# Patient Record
Sex: Male | Born: 1965 | Race: White | Hispanic: No | Marital: Married | Smoking: Never smoker
Health system: Southern US, Community
[De-identification: ages and names within clinical notes are randomized; demographics above are authoritative.]

## PROBLEM LIST (undated history)

## (undated) DIAGNOSIS — Z79899 Other long term (current) drug therapy: Secondary | ICD-10-CM

## (undated) DIAGNOSIS — R252 Cramp and spasm: Secondary | ICD-10-CM

## (undated) DIAGNOSIS — E041 Nontoxic single thyroid nodule: Secondary | ICD-10-CM

## (undated) DIAGNOSIS — M549 Dorsalgia, unspecified: Secondary | ICD-10-CM

## (undated) DIAGNOSIS — E890 Postprocedural hypothyroidism: Secondary | ICD-10-CM

## (undated) DIAGNOSIS — C73 Malignant neoplasm of thyroid gland: Secondary | ICD-10-CM

## (undated) DIAGNOSIS — N419 Inflammatory disease of prostate, unspecified: Secondary | ICD-10-CM

## (undated) HISTORY — DX: Postprocedural hypothyroidism: E89.0

## (undated) HISTORY — DX: Malignant neoplasm of thyroid gland: C73

## (undated) HISTORY — DX: Other long term (current) drug therapy: Z79.899

## (undated) HISTORY — DX: Cramp and spasm: R25.2

## (undated) HISTORY — DX: Inflammatory disease of prostate, unspecified: N41.9

## (undated) HISTORY — DX: Nontoxic single thyroid nodule: E04.1

## (undated) HISTORY — DX: Dorsalgia, unspecified: M54.9

---

## 1999-11-02 DIAGNOSIS — N419 Inflammatory disease of prostate, unspecified: Secondary | ICD-10-CM

## 1999-11-02 HISTORY — DX: Inflammatory disease of prostate, unspecified: N41.9

## 2000-12-07 LAB — HM COLONOSCOPY

## 2003-09-18 ENCOUNTER — Encounter: Admission: RE | Admit: 2003-09-18 | Discharge: 2003-09-18 | Payer: Self-pay | Admitting: Endocrinology

## 2004-12-02 ENCOUNTER — Ambulatory Visit: Payer: Self-pay | Admitting: Internal Medicine

## 2005-08-06 ENCOUNTER — Ambulatory Visit: Payer: Self-pay | Admitting: Endocrinology

## 2005-08-13 ENCOUNTER — Ambulatory Visit: Payer: Self-pay | Admitting: Endocrinology

## 2006-08-10 ENCOUNTER — Ambulatory Visit: Payer: Self-pay | Admitting: Endocrinology

## 2006-08-10 LAB — CONVERTED CEMR LAB
ALT: 22 units/L (ref 0–40)
AST: 21 units/L (ref 0–37)
Albumin: 4 g/dL (ref 3.5–5.2)
Alkaline Phosphatase: 52 units/L (ref 39–117)
BUN: 12 mg/dL (ref 6–23)
Basophils Absolute: 0 10*3/uL (ref 0.0–0.1)
Basophils Relative: 0.7 % (ref 0.0–1.0)
Bilirubin Urine: NEGATIVE
CO2: 30 meq/L (ref 19–32)
Calcium: 9.7 mg/dL (ref 8.4–10.5)
Chloride: 105 meq/L (ref 96–112)
Chol/HDL Ratio, serum: 3.5
Cholesterol: 145 mg/dL (ref 0–200)
Creatinine, Ser: 1.1 mg/dL (ref 0.4–1.5)
Eosinophil percent: 1.6 % (ref 0.0–5.0)
GFR calc non Af Amer: 80 mL/min
Glomerular Filtration Rate, Af Am: 96 mL/min/{1.73_m2}
Glucose, Bld: 87 mg/dL (ref 70–99)
HCT: 46.4 % (ref 39.0–52.0)
HDL: 41.1 mg/dL (ref >39.0)
Hemoglobin, Urine: NEGATIVE
Hemoglobin: 15.9 g/dL (ref 13.0–17.0)
Ketones, ur: NEGATIVE mg/dL
LDL Cholesterol: 92 mg/dL (ref 0–99)
Leukocytes, UA: NEGATIVE
Lymphocytes Relative: 31.9 % (ref 12.0–46.0)
MCHC: 34.2 g/dL (ref 30.0–36.0)
MCV: 88.8 fL (ref 78.0–100.0)
Monocytes Absolute: 0.4 10*3/uL (ref 0.2–0.7)
Monocytes Relative: 7.2 % (ref 3.0–11.0)
Neutro Abs: 3.2 10*3/uL (ref 1.4–7.7)
Neutrophils Relative %: 58.6 % (ref 43.0–77.0)
Nitrite: NEGATIVE
Platelets: 176 10*3/uL (ref 150–400)
Potassium: 4.6 meq/L (ref 3.5–5.1)
RBC: 5.23 M/uL (ref 4.22–5.81)
RDW: 11.5 % (ref 11.5–14.6)
Sodium: 141 meq/L (ref 135–145)
Specific Gravity, Urine: 1.025 (ref 1.000–1.03)
TSH: 2.3 microintl units/mL (ref 0.35–5.50)
Total Bilirubin: 1.1 mg/dL (ref 0.3–1.2)
Total Protein, Urine: NEGATIVE mg/dL
Total Protein: 6.6 g/dL (ref 6.0–8.3)
Triglyceride fasting, serum: 60 mg/dL (ref 0–149)
Urine Glucose: NEGATIVE mg/dL
Urobilinogen, UA: 0.2 (ref 0.0–1.0)
VLDL: 12 mg/dL (ref 0–40)
WBC: 5.5 10*3/uL (ref 4.5–10.5)
pH: 6 (ref 5.0–8.0)

## 2006-08-15 ENCOUNTER — Ambulatory Visit: Payer: Self-pay | Admitting: Endocrinology

## 2007-01-09 ENCOUNTER — Ambulatory Visit: Payer: Self-pay | Admitting: Internal Medicine

## 2007-01-09 ENCOUNTER — Ambulatory Visit (HOSPITAL_COMMUNITY): Admission: RE | Admit: 2007-01-09 | Discharge: 2007-01-09 | Payer: Self-pay | Admitting: Internal Medicine

## 2007-07-13 ENCOUNTER — Encounter: Payer: Self-pay | Admitting: Endocrinology

## 2007-08-14 ENCOUNTER — Ambulatory Visit: Payer: Self-pay | Admitting: Endocrinology

## 2007-08-14 LAB — CONVERTED CEMR LAB
ALT: 21 units/L (ref 0–53)
AST: 27 units/L (ref 0–37)
Albumin: 4 g/dL (ref 3.5–5.2)
Alkaline Phosphatase: 50 units/L (ref 39–117)
BUN: 20 mg/dL (ref 6–23)
Basophils Absolute: 0 10*3/uL (ref 0.0–0.1)
Basophils Relative: 0.4 % (ref 0.0–1.0)
Bilirubin Urine: NEGATIVE
Bilirubin, Direct: 0.2 mg/dL (ref 0.0–0.3)
CO2: 30 meq/L (ref 19–32)
Calcium: 9.4 mg/dL (ref 8.4–10.5)
Chloride: 106 meq/L (ref 96–112)
Cholesterol: 142 mg/dL (ref 0–200)
Creatinine, Ser: 1 mg/dL (ref 0.4–1.5)
Eosinophils Absolute: 0.1 10*3/uL (ref 0.0–0.6)
Eosinophils Relative: 1.2 % (ref 0.0–5.0)
GFR calc Af Amer: 107 mL/min
GFR calc non Af Amer: 88 mL/min
Glucose, Bld: 81 mg/dL (ref 70–99)
HCT: 44.3 % (ref 39.0–52.0)
HDL: 47.6 mg/dL (ref >39.0)
Hemoglobin, Urine: NEGATIVE
Hemoglobin: 15.4 g/dL (ref 13.0–17.0)
Ketones, ur: NEGATIVE mg/dL
LDL Cholesterol: 82 mg/dL (ref 0–99)
Leukocytes, UA: NEGATIVE
Lymphocytes Relative: 30.7 % (ref 12.0–46.0)
MCHC: 34.7 g/dL (ref 30.0–36.0)
MCV: 89.1 fL (ref 78.0–100.0)
Monocytes Absolute: 0.4 10*3/uL (ref 0.2–0.7)
Monocytes Relative: 7.7 % (ref 3.0–11.0)
Neutro Abs: 3.1 10*3/uL (ref 1.4–7.7)
Neutrophils Relative %: 60 % (ref 43.0–77.0)
Nitrite: NEGATIVE
Platelets: 150 10*3/uL (ref 150–400)
Potassium: 4.8 meq/L (ref 3.5–5.1)
RBC: 4.97 M/uL (ref 4.22–5.81)
RDW: 11.5 % (ref 11.5–14.6)
Sodium: 142 meq/L (ref 135–145)
Specific Gravity, Urine: 1.01 (ref 1.000–1.03)
TSH: 1.99 microintl units/mL (ref 0.35–5.50)
Total Bilirubin: 0.9 mg/dL (ref 0.3–1.2)
Total CHOL/HDL Ratio: 3
Total Protein, Urine: NEGATIVE mg/dL
Total Protein: 6.6 g/dL (ref 6.0–8.3)
Triglycerides: 62 mg/dL (ref 0–149)
Urine Glucose: NEGATIVE mg/dL
Urobilinogen, UA: 0.2 (ref 0.0–1.0)
VLDL: 12 mg/dL (ref 0–40)
WBC: 5.2 10*3/uL (ref 4.5–10.5)
pH: 7 (ref 5.0–8.0)

## 2007-08-18 ENCOUNTER — Ambulatory Visit: Payer: Self-pay | Admitting: Endocrinology

## 2008-04-04 ENCOUNTER — Ambulatory Visit: Payer: Self-pay | Admitting: Internal Medicine

## 2008-04-04 DIAGNOSIS — M549 Dorsalgia, unspecified: Secondary | ICD-10-CM | POA: Insufficient documentation

## 2008-05-16 ENCOUNTER — Telehealth: Payer: Self-pay | Admitting: Endocrinology

## 2008-08-08 ENCOUNTER — Telehealth (INDEPENDENT_AMBULATORY_CARE_PROVIDER_SITE_OTHER): Payer: Self-pay | Admitting: *Deleted

## 2008-08-16 ENCOUNTER — Ambulatory Visit: Payer: Self-pay | Admitting: Endocrinology

## 2008-08-18 LAB — CONVERTED CEMR LAB
ALT: 18 units/L (ref 0–53)
AST: 21 units/L (ref 0–37)
Albumin: 4.2 g/dL (ref 3.5–5.2)
Alkaline Phosphatase: 50 units/L (ref 39–117)
BUN: 18 mg/dL (ref 6–23)
Basophils Absolute: 0 10*3/uL (ref 0.0–0.1)
Basophils Relative: 0.7 % (ref 0.0–3.0)
Bilirubin Urine: NEGATIVE
Bilirubin, Direct: 0.2 mg/dL (ref 0.0–0.3)
CO2: 30 meq/L (ref 19–32)
Calcium: 9.6 mg/dL (ref 8.4–10.5)
Chloride: 104 meq/L (ref 96–112)
Cholesterol: 130 mg/dL (ref 0–200)
Creatinine, Ser: 1 mg/dL (ref 0.4–1.5)
Eosinophils Absolute: 0.1 10*3/uL (ref 0.0–0.7)
Eosinophils Relative: 1.1 % (ref 0.0–5.0)
GFR calc Af Amer: 106 mL/min
GFR calc non Af Amer: 88 mL/min
Glucose, Bld: 82 mg/dL (ref 70–99)
HCT: 44.6 % (ref 39.0–52.0)
HDL: 37.3 mg/dL — ABNORMAL LOW (ref >39.0)
Hemoglobin, Urine: NEGATIVE
Hemoglobin: 15.7 g/dL (ref 13.0–17.0)
Ketones, ur: NEGATIVE mg/dL
LDL Cholesterol: 78 mg/dL (ref 0–99)
Leukocytes, UA: NEGATIVE
Lymphocytes Relative: 30.3 % (ref 12.0–46.0)
MCHC: 35.2 g/dL (ref 30.0–36.0)
MCV: 89.9 fL (ref 78.0–100.0)
Monocytes Absolute: 0.4 10*3/uL (ref 0.1–1.0)
Monocytes Relative: 6.8 % (ref 3.0–12.0)
Neutro Abs: 3.3 10*3/uL (ref 1.4–7.7)
Neutrophils Relative %: 61.1 % (ref 43.0–77.0)
Nitrite: NEGATIVE
PSA: 0.84 ng/mL (ref 0.10–4.00)
Platelets: 160 10*3/uL (ref 150–400)
Potassium: 4.6 meq/L (ref 3.5–5.1)
RBC: 4.96 M/uL (ref 4.22–5.81)
RDW: 11.7 % (ref 11.5–14.6)
Sodium: 140 meq/L (ref 135–145)
Specific Gravity, Urine: 1.02 (ref 1.000–1.03)
TSH: 1.95 microintl units/mL (ref 0.35–5.50)
Total Bilirubin: 1.2 mg/dL (ref 0.3–1.2)
Total CHOL/HDL Ratio: 3.5
Total Protein: 7.1 g/dL (ref 6.0–8.3)
Triglycerides: 75 mg/dL (ref 0–149)
Urine Glucose: NEGATIVE mg/dL
Urobilinogen, UA: 0.2 (ref 0.0–1.0)
VLDL: 15 mg/dL (ref 0–40)
WBC: 5.4 10*3/uL (ref 4.5–10.5)
pH: 6 (ref 5.0–8.0)

## 2008-08-21 ENCOUNTER — Ambulatory Visit: Payer: Self-pay | Admitting: Endocrinology

## 2008-08-22 ENCOUNTER — Ambulatory Visit: Payer: Self-pay | Admitting: Endocrinology

## 2008-08-22 ENCOUNTER — Encounter: Admission: RE | Admit: 2008-08-22 | Discharge: 2008-08-22 | Payer: Self-pay | Admitting: Endocrinology

## 2008-08-22 ENCOUNTER — Telehealth: Payer: Self-pay | Admitting: Endocrinology

## 2008-08-22 ENCOUNTER — Other Ambulatory Visit: Admission: RE | Admit: 2008-08-22 | Discharge: 2008-08-22 | Payer: Self-pay | Admitting: Endocrinology

## 2008-08-22 ENCOUNTER — Encounter: Payer: Self-pay | Admitting: Endocrinology

## 2008-08-26 ENCOUNTER — Telehealth: Payer: Self-pay | Admitting: Endocrinology

## 2008-08-28 ENCOUNTER — Encounter: Payer: Self-pay | Admitting: Endocrinology

## 2008-09-19 ENCOUNTER — Ambulatory Visit (HOSPITAL_COMMUNITY): Admission: RE | Admit: 2008-09-19 | Discharge: 2008-09-20 | Payer: Self-pay | Admitting: Surgery

## 2008-09-19 ENCOUNTER — Encounter (INDEPENDENT_AMBULATORY_CARE_PROVIDER_SITE_OTHER): Payer: Self-pay | Admitting: Surgery

## 2008-09-25 ENCOUNTER — Telehealth: Payer: Self-pay | Admitting: Endocrinology

## 2008-10-01 ENCOUNTER — Telehealth: Payer: Self-pay | Admitting: Endocrinology

## 2008-10-03 ENCOUNTER — Telehealth: Payer: Self-pay | Admitting: Endocrinology

## 2008-10-07 ENCOUNTER — Encounter: Payer: Self-pay | Admitting: Endocrinology

## 2008-11-05 ENCOUNTER — Ambulatory Visit: Payer: Self-pay | Admitting: Endocrinology

## 2008-11-05 DIAGNOSIS — E890 Postprocedural hypothyroidism: Secondary | ICD-10-CM

## 2008-11-05 DIAGNOSIS — C73 Malignant neoplasm of thyroid gland: Secondary | ICD-10-CM | POA: Insufficient documentation

## 2008-11-05 LAB — CONVERTED CEMR LAB: TSH: 2.06 microintl units/mL (ref 0.35–5.50)

## 2008-12-05 ENCOUNTER — Telehealth: Payer: Self-pay | Admitting: Endocrinology

## 2008-12-11 ENCOUNTER — Ambulatory Visit: Payer: Self-pay | Admitting: Endocrinology

## 2008-12-11 LAB — CONVERTED CEMR LAB: TSH: >100 microintl units/mL — ABNORMAL HIGH (ref 0.35–5.50)

## 2008-12-13 ENCOUNTER — Encounter (HOSPITAL_COMMUNITY): Admission: RE | Admit: 2008-12-13 | Discharge: 2008-12-13 | Payer: Self-pay | Admitting: Endocrinology

## 2009-03-13 ENCOUNTER — Ambulatory Visit: Payer: Self-pay | Admitting: Endocrinology

## 2009-03-13 DIAGNOSIS — R252 Cramp and spasm: Secondary | ICD-10-CM | POA: Insufficient documentation

## 2009-03-13 LAB — CONVERTED CEMR LAB
Calcium, Total (PTH): 9.2 mg/dL (ref 8.4–10.5)
PTH: 39.1 pg/mL (ref 14.0–72.0)
TSH: 2.88 microintl units/mL (ref 0.35–5.50)

## 2009-05-14 ENCOUNTER — Telehealth (INDEPENDENT_AMBULATORY_CARE_PROVIDER_SITE_OTHER): Payer: Self-pay | Admitting: *Deleted

## 2009-05-15 ENCOUNTER — Encounter: Payer: Self-pay | Admitting: Endocrinology

## 2009-06-09 ENCOUNTER — Ambulatory Visit: Payer: Self-pay | Admitting: Endocrinology

## 2009-06-11 LAB — CONVERTED CEMR LAB: TSH: 1.12 microintl units/mL (ref 0.35–5.50)

## 2009-06-16 ENCOUNTER — Ambulatory Visit: Payer: Self-pay | Admitting: Endocrinology

## 2009-07-17 ENCOUNTER — Ambulatory Visit: Payer: Self-pay | Admitting: Endocrinology

## 2009-07-17 LAB — CONVERTED CEMR LAB: Thyroglobulin Ab: 37.7 (ref 0.0–60.0)

## 2009-07-18 ENCOUNTER — Ambulatory Visit: Payer: Self-pay | Admitting: Endocrinology

## 2009-07-18 LAB — CONVERTED CEMR LAB: TSH: >100 microintl units/mL — ABNORMAL HIGH (ref 0.35–5.50)

## 2009-07-21 ENCOUNTER — Telehealth: Payer: Self-pay | Admitting: Endocrinology

## 2009-07-22 ENCOUNTER — Encounter: Admission: RE | Admit: 2009-07-22 | Discharge: 2009-07-22 | Payer: Self-pay | Admitting: Endocrinology

## 2009-07-24 ENCOUNTER — Telehealth (INDEPENDENT_AMBULATORY_CARE_PROVIDER_SITE_OTHER): Payer: Self-pay | Admitting: *Deleted

## 2009-07-25 ENCOUNTER — Telehealth: Payer: Self-pay | Admitting: Endocrinology

## 2009-07-25 ENCOUNTER — Ambulatory Visit (HOSPITAL_COMMUNITY): Admission: RE | Admit: 2009-07-25 | Discharge: 2009-07-25 | Payer: Self-pay | Admitting: Endocrinology

## 2009-08-01 ENCOUNTER — Telehealth: Payer: Self-pay | Admitting: Endocrinology

## 2009-08-04 ENCOUNTER — Encounter (HOSPITAL_COMMUNITY): Admission: RE | Admit: 2009-08-04 | Discharge: 2009-10-03 | Payer: Self-pay | Admitting: Endocrinology

## 2009-09-03 ENCOUNTER — Encounter: Payer: Self-pay | Admitting: Endocrinology

## 2009-09-08 IMAGING — US US SOFT TISSUE HEAD/NECK
1 series · 14 of 18 positions shown · non-contrast
Comparison: None

CLINICAL DATA: Evaluate.  Thyroid nodule.

THYROID ULTRASOUND
TECHNIQUE: Ultrasound examination of the thyroid gland and
adjacent soft tissues was performed.

[Series 1: us soft tissue head/neck · 0.07mm/px · 14 of 18 slices shown]
[im 1/18]
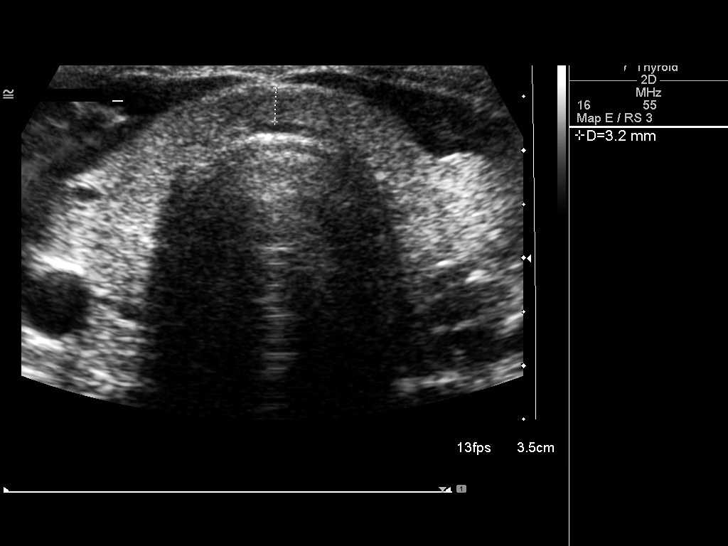
[im 2/18]
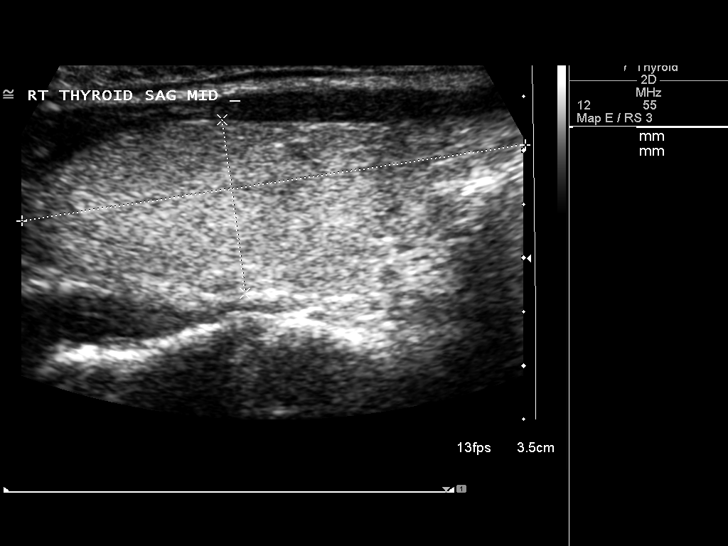
[im 4/18]
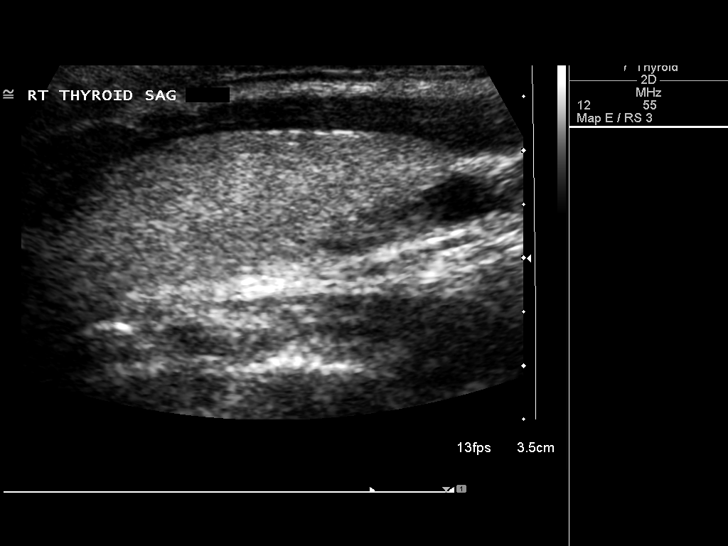
[im 5/18]
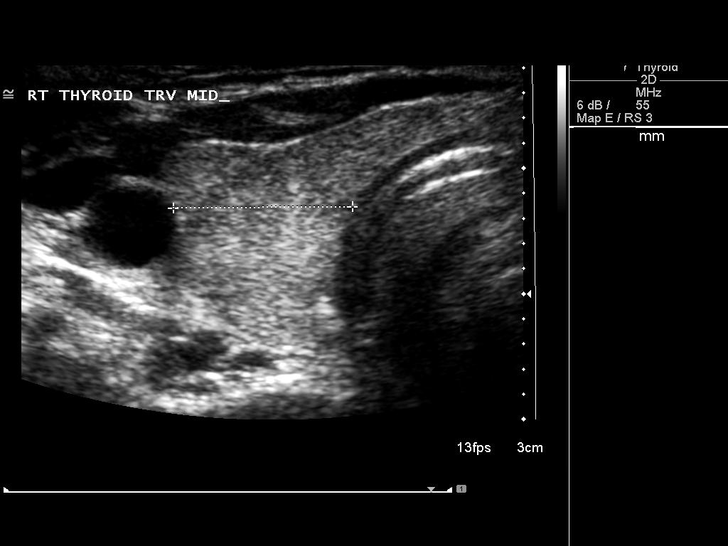
[im 6/18]
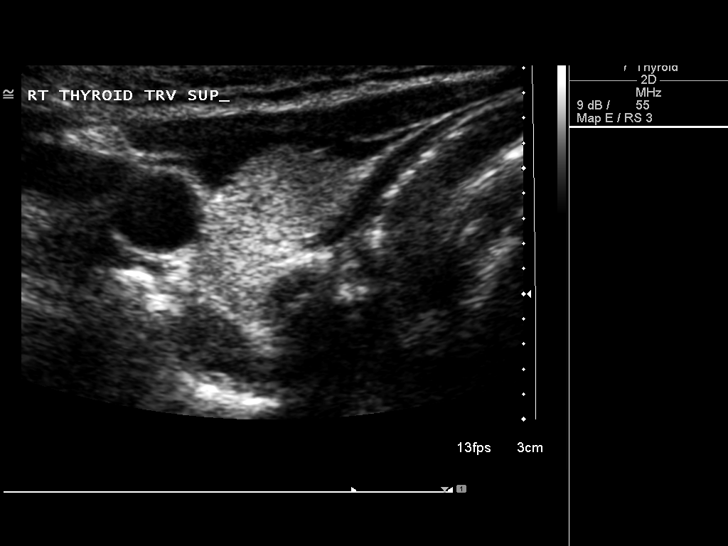
[im 8/18]
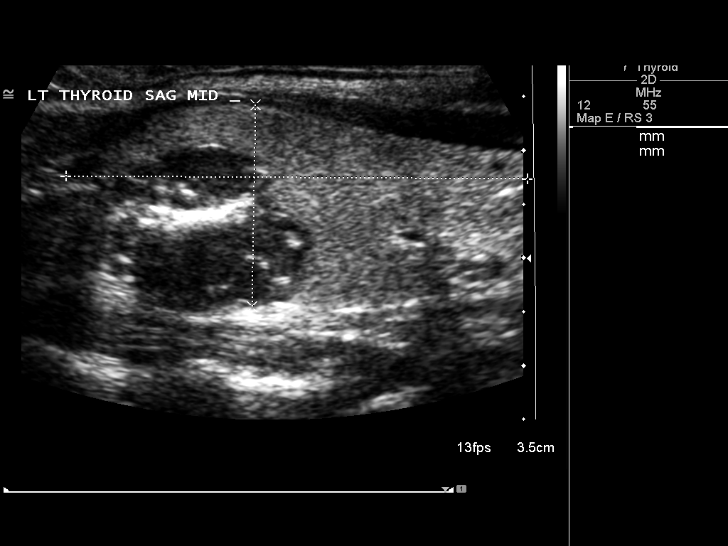
[im 9/18]
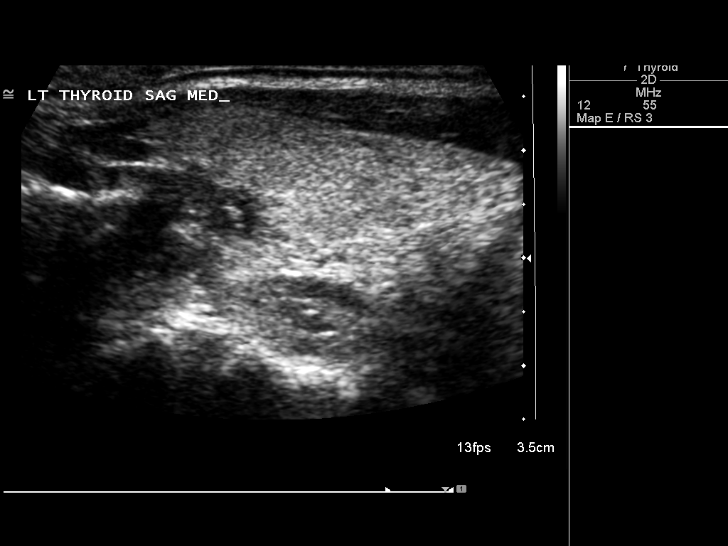
[im 10/18]
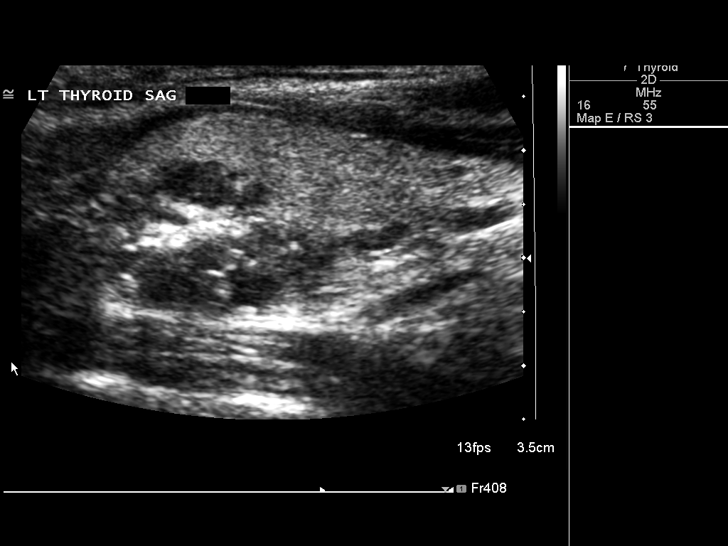
[im 11/18]
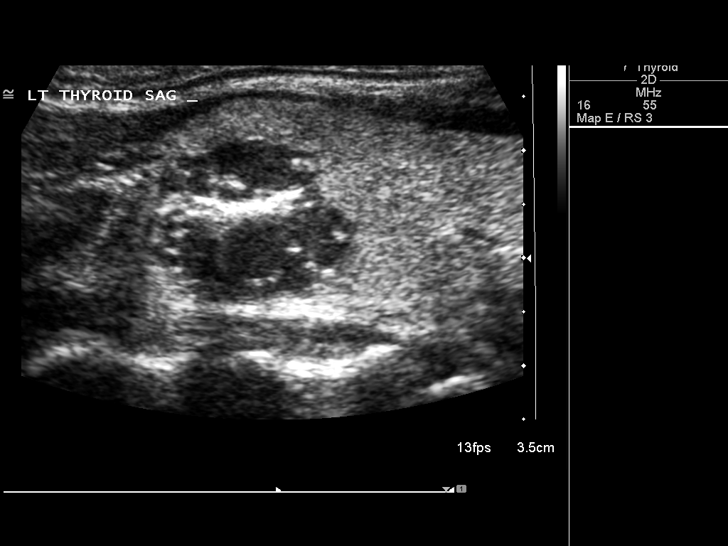
[im 13/18]
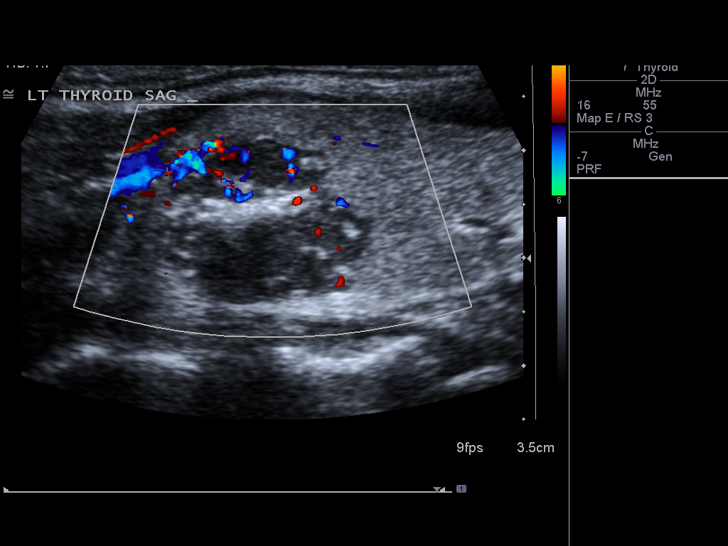
[im 14/18]
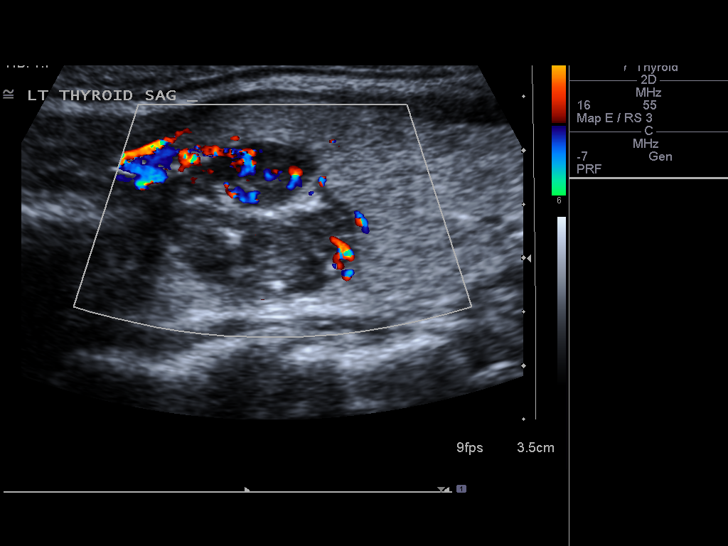
[im 15/18]
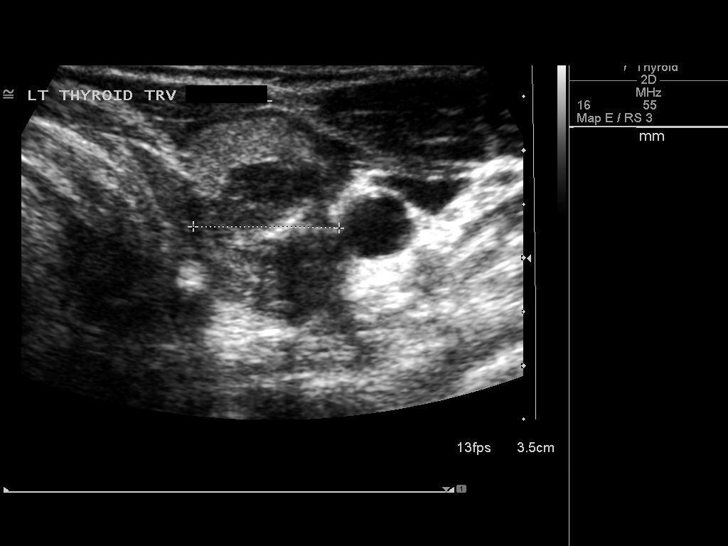
[im 17/18]
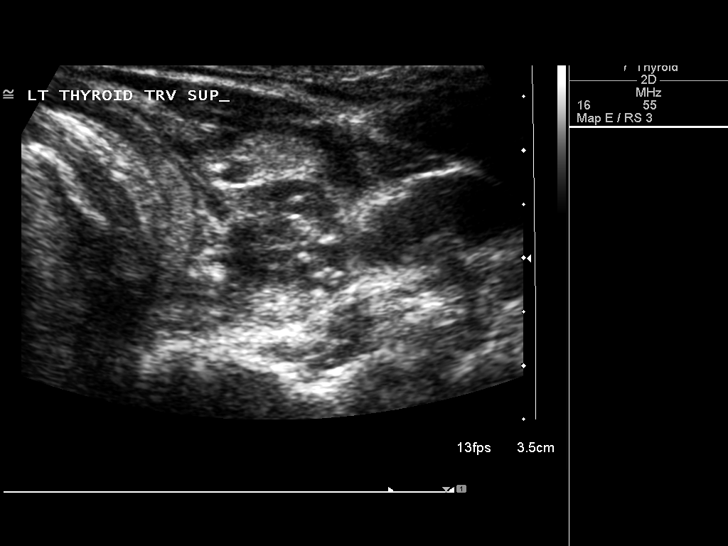
[im 18/18]
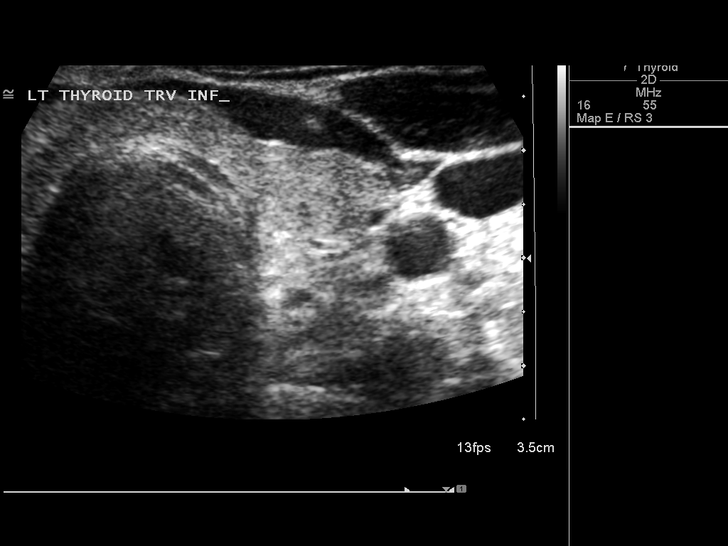

[14 of 18 positions shown; findings below may reference images not displayed]

FINDINGS: There is a 1.9 x 1.8 x 1.3 cm solid nodule within the
left lobe of the thyroid gland.  There is increased vascularity and
evidence of microcalcifications.

Remaining portions of the thyroid gland appear normal

The right lobe measures 4.7 x 1.6 x 1.4 cm.

The left lobe measures 4.3 x 1.81 x 1.5 cm.

The isthmus measures 3.2 mm.
IMPRESSION: 1.  Hypoechoic solid nodule within the left lobe of the thyroid
gland contains increased vascularity and evidence of
microcalcifications.  Correlation with percutaneous biopsy results
advised

Test results telephoned to Dr. Boodram at the time of

## 2009-09-15 ENCOUNTER — Ambulatory Visit: Payer: Self-pay | Admitting: Endocrinology

## 2009-09-15 LAB — CONVERTED CEMR LAB
ALT: 19 units/L (ref 0–53)
AST: 21 units/L (ref 0–37)
Albumin: 4.3 g/dL (ref 3.5–5.2)
Alkaline Phosphatase: 44 units/L (ref 39–117)
BUN: 24 mg/dL — ABNORMAL HIGH (ref 6–23)
Basophils Absolute: 0 10*3/uL (ref 0.0–0.1)
Basophils Relative: 0.1 % (ref 0.0–3.0)
Bilirubin Urine: NEGATIVE
Bilirubin, Direct: 0.2 mg/dL (ref 0.0–0.3)
CO2: 28 meq/L (ref 19–32)
Calcium: 8.6 mg/dL (ref 8.4–10.5)
Chloride: 104 meq/L (ref 96–112)
Cholesterol: 126 mg/dL (ref 0–200)
Creatinine, Ser: 1 mg/dL (ref 0.4–1.5)
Eosinophils Absolute: 0 10*3/uL (ref 0.0–0.7)
Eosinophils Relative: 0.2 % (ref 0.0–5.0)
GFR calc non Af Amer: 87.34 mL/min (ref >60)
Glucose, Bld: 93 mg/dL (ref 70–99)
HCT: 41.2 % (ref 39.0–52.0)
HDL: 47.1 mg/dL (ref >39.00)
Hemoglobin, Urine: NEGATIVE
Hemoglobin: 14.2 g/dL (ref 13.0–17.0)
Ketones, ur: NEGATIVE mg/dL
LDL Cholesterol: 73 mg/dL (ref 0–99)
Leukocytes, UA: NEGATIVE
Lymphocytes Relative: 3.5 % — ABNORMAL LOW (ref 12.0–46.0)
Lymphs Abs: 0.2 10*3/uL — ABNORMAL LOW (ref 0.7–4.0)
MCHC: 34.6 g/dL (ref 30.0–36.0)
MCV: 93.1 fL (ref 78.0–100.0)
Monocytes Absolute: 0.3 10*3/uL (ref 0.1–1.0)
Monocytes Relative: 5.2 % (ref 3.0–12.0)
Neutro Abs: 6.1 10*3/uL (ref 1.4–7.7)
Neutrophils Relative %: 91 % — ABNORMAL HIGH (ref 43.0–77.0)
Nitrite: NEGATIVE
PSA: 0.83 ng/mL (ref 0.10–4.00)
Platelets: 129 10*3/uL — ABNORMAL LOW (ref 150.0–400.0)
Potassium: 4.3 meq/L (ref 3.5–5.1)
RBC: 4.42 M/uL (ref 4.22–5.81)
RDW: 13.8 % (ref 11.5–14.6)
Sodium: 141 meq/L (ref 135–145)
Specific Gravity, Urine: 1.02 (ref 1.000–1.030)
TSH: 0.89 microintl units/mL (ref 0.35–5.50)
Total Bilirubin: 1.3 mg/dL — ABNORMAL HIGH (ref 0.3–1.2)
Total CHOL/HDL Ratio: 3
Total Protein, Urine: NEGATIVE mg/dL
Total Protein: 7.2 g/dL (ref 6.0–8.3)
Triglycerides: 30 mg/dL (ref 0.0–149.0)
Urine Glucose: NEGATIVE mg/dL
Urobilinogen, UA: 0.2 (ref 0.0–1.0)
VLDL: 6 mg/dL (ref 0.0–40.0)
WBC: 6.6 10*3/uL (ref 4.5–10.5)
pH: 5.5 (ref 5.0–8.0)

## 2009-10-15 ENCOUNTER — Ambulatory Visit: Payer: Self-pay | Admitting: Endocrinology

## 2009-10-15 DIAGNOSIS — D696 Thrombocytopenia, unspecified: Secondary | ICD-10-CM

## 2009-11-14 ENCOUNTER — Ambulatory Visit: Payer: Self-pay | Admitting: Endocrinology

## 2009-11-23 LAB — CONVERTED CEMR LAB
Basophils Absolute: 0 10*3/uL (ref 0.0–0.1)
Basophils Relative: 0.2 % (ref 0.0–3.0)
Eosinophils Absolute: 0 10*3/uL (ref 0.0–0.7)
Eosinophils Relative: 1.3 % (ref 0.0–5.0)
HCT: 44.6 % (ref 39.0–52.0)
Hemoglobin: 14.7 g/dL (ref 13.0–17.0)
Lymphocytes Relative: 25 % (ref 12.0–46.0)
Lymphs Abs: 0.9 10*3/uL (ref 0.7–4.0)
MCHC: 32.9 g/dL (ref 30.0–36.0)
MCV: 94.7 fL (ref 78.0–100.0)
Monocytes Absolute: 0.3 10*3/uL (ref 0.1–1.0)
Monocytes Relative: 8.6 % (ref 3.0–12.0)
Neutro Abs: 2.3 10*3/uL (ref 1.4–7.7)
Neutrophils Relative %: 64.9 % (ref 43.0–77.0)
Platelets: 120 10*3/uL — ABNORMAL LOW (ref 150.0–400.0)
RBC: 4.71 M/uL (ref 4.22–5.81)
RDW: 11.7 % (ref 11.5–14.6)
TSH: 0.14 microintl units/mL — ABNORMAL LOW (ref 0.35–5.50)
WBC: 3.5 10*3/uL — ABNORMAL LOW (ref 4.5–10.5)

## 2009-11-28 ENCOUNTER — Ambulatory Visit: Payer: Self-pay | Admitting: Endocrinology

## 2009-11-28 DIAGNOSIS — K1120 Sialoadenitis, unspecified: Secondary | ICD-10-CM | POA: Insufficient documentation

## 2009-11-28 LAB — CONVERTED CEMR LAB
Amylase: 638 U/L — ABNORMAL HIGH
Basophils Absolute: 0 K/uL
Basophils Relative: 0.4 %
Eosinophils Absolute: 0 K/uL
Eosinophils Relative: 0.6 %
HCT: 42.8 %
Hemoglobin: 14.1 g/dL
Lymphocytes Relative: 20 %
Lymphs Abs: 0.9 K/uL
MCHC: 32.9 g/dL
MCV: 93.4 fL
Monocytes Absolute: 0.4 K/uL
Monocytes Relative: 9.1 %
Neutro Abs: 3.3 K/uL
Neutrophils Relative %: 69.9 %
Platelets: 149 K/uL — ABNORMAL LOW
RBC: 4.58 M/uL
RDW: 11.6 %
WBC: 4.6 10*3/microliter

## 2009-12-01 LAB — CONVERTED CEMR LAB
Calcium, Total (PTH): 9 mg/dL (ref 8.4–10.5)
PTH: 30.3 pg/mL (ref 14.0–72.0)

## 2009-12-12 ENCOUNTER — Telehealth: Payer: Self-pay | Admitting: Endocrinology

## 2009-12-15 ENCOUNTER — Telehealth: Payer: Self-pay | Admitting: Endocrinology

## 2010-01-09 ENCOUNTER — Ambulatory Visit (HOSPITAL_COMMUNITY): Admission: RE | Admit: 2010-01-09 | Discharge: 2010-01-09 | Payer: Self-pay | Admitting: Otolaryngology

## 2010-01-30 ENCOUNTER — Telehealth: Payer: Self-pay | Admitting: Endocrinology

## 2010-01-30 ENCOUNTER — Ambulatory Visit: Payer: Self-pay | Admitting: Endocrinology

## 2010-01-30 LAB — CONVERTED CEMR LAB: TSH: 0.09 microintl units/mL — ABNORMAL LOW (ref 0.35–5.50)

## 2010-02-02 LAB — CONVERTED CEMR LAB: Thyroglobulin Ab: 41.6 (ref 0.0–60.0)

## 2010-02-09 ENCOUNTER — Ambulatory Visit: Payer: Self-pay | Admitting: Endocrinology

## 2010-05-12 ENCOUNTER — Ambulatory Visit: Payer: Self-pay | Admitting: Endocrinology

## 2010-05-12 LAB — CONVERTED CEMR LAB
Basophils Absolute: 0 10*3/uL (ref 0.0–0.1)
Basophils Relative: 0.6 % (ref 0.0–3.0)
Eosinophils Absolute: 0 10*3/uL (ref 0.0–0.7)
Eosinophils Relative: 0.8 % (ref 0.0–5.0)
HCT: 41.4 % (ref 39.0–52.0)
Hemoglobin: 14.6 g/dL (ref 13.0–17.0)
Lymphocytes Relative: 26.4 % (ref 12.0–46.0)
Lymphs Abs: 1 10*3/uL (ref 0.7–4.0)
MCHC: 35.2 g/dL (ref 30.0–36.0)
MCV: 89.5 fL (ref 78.0–100.0)
Monocytes Absolute: 0.3 10*3/uL (ref 0.1–1.0)
Monocytes Relative: 8.1 % (ref 3.0–12.0)
Neutro Abs: 2.5 10*3/uL (ref 1.4–7.7)
Neutrophils Relative %: 64.1 % (ref 43.0–77.0)
Platelets: 133 10*3/uL — ABNORMAL LOW (ref 150.0–400.0)
RBC: 4.62 M/uL (ref 4.22–5.81)
RDW: 12.3 % (ref 11.5–14.6)
TSH: 0.07 microintl units/mL — ABNORMAL LOW (ref 0.35–5.50)
WBC: 3.9 10*3/uL — ABNORMAL LOW (ref 4.5–10.5)

## 2010-05-13 LAB — CONVERTED CEMR LAB
Thyroglobulin Ab: 29 (ref 0.0–60.0)
Thyroperoxidase Ab SerPl-aCnc: 54.8 (ref 0.0–60.0)

## 2010-08-08 IMAGING — US US SOFT TISSUE HEAD/NECK
1 series · 14 of 17 positions shown · non-contrast
Comparison: 08/22/2008.

CLINICAL DATA: Thyroidectomy for cancer.  Rule out residual tumor.

THYROID ULTRASOUND
TECHNIQUE: Ultrasound examination of the thyroid gland and
adjacent soft tissues was performed.

[Series 1: us soft tissue head/neck · 0.05mm/px · 14 of 17 slices shown]
[im 1/17]
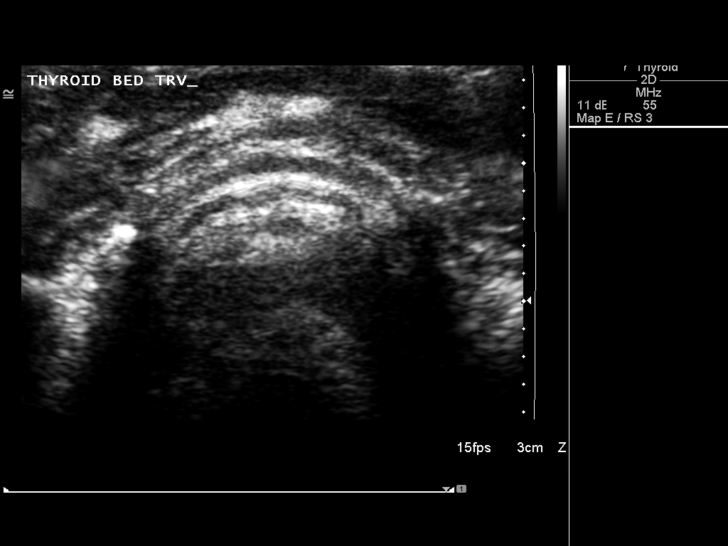
[im 2/17]
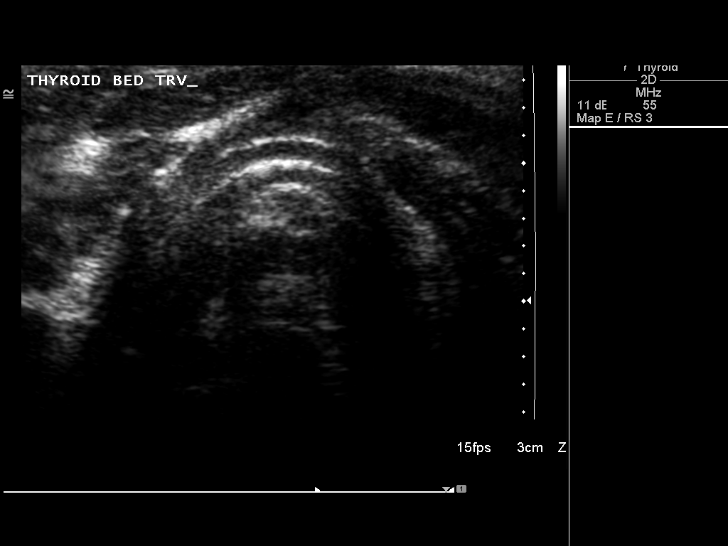
[im 4/17]
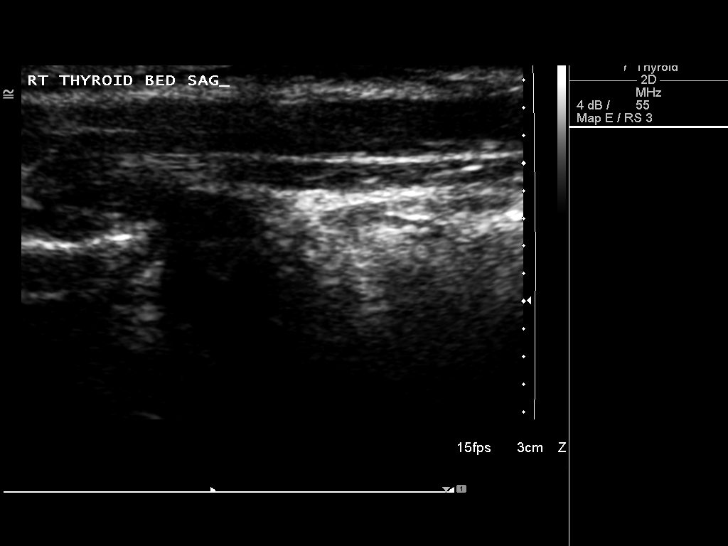
[im 5/17]
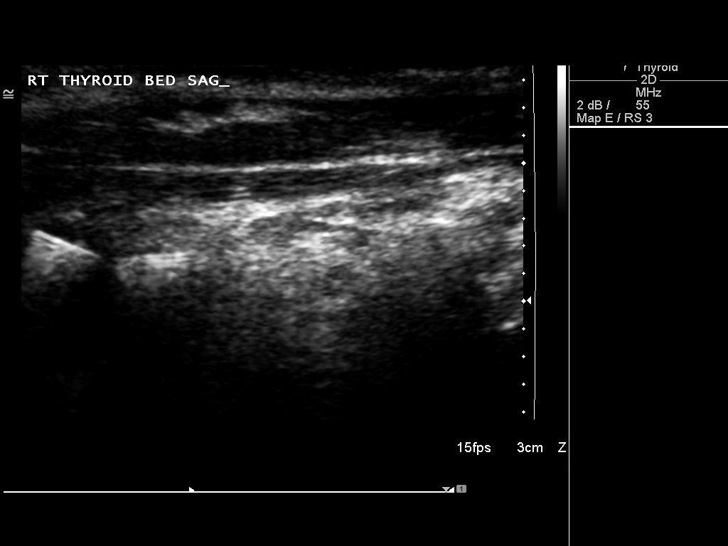
[im 6/17]
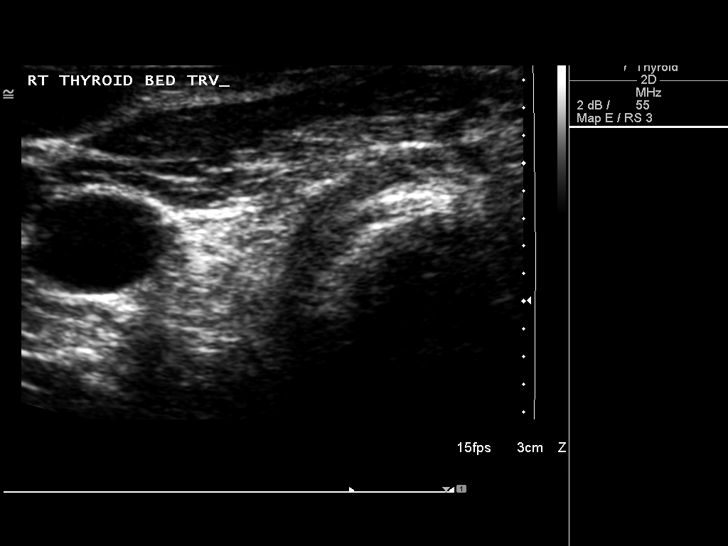
[im 7/17]
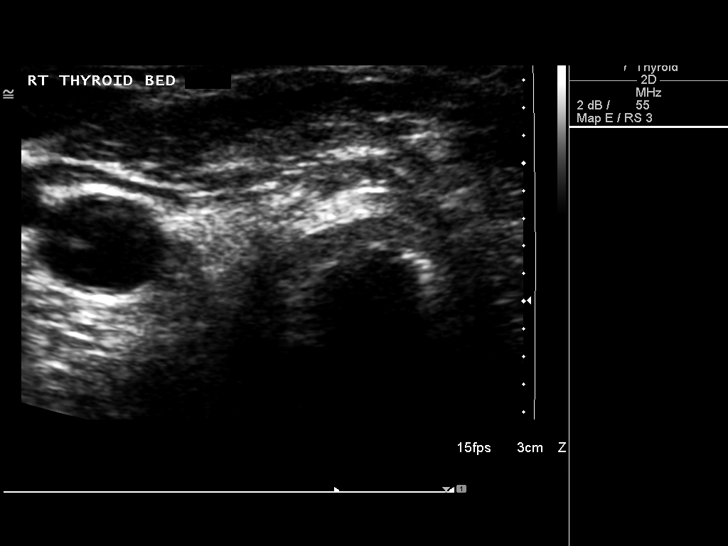
[im 8/17]
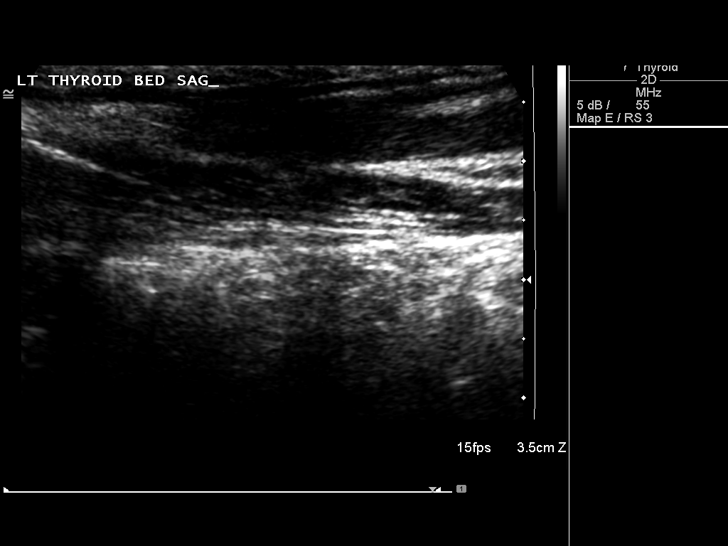
[im 10/17]
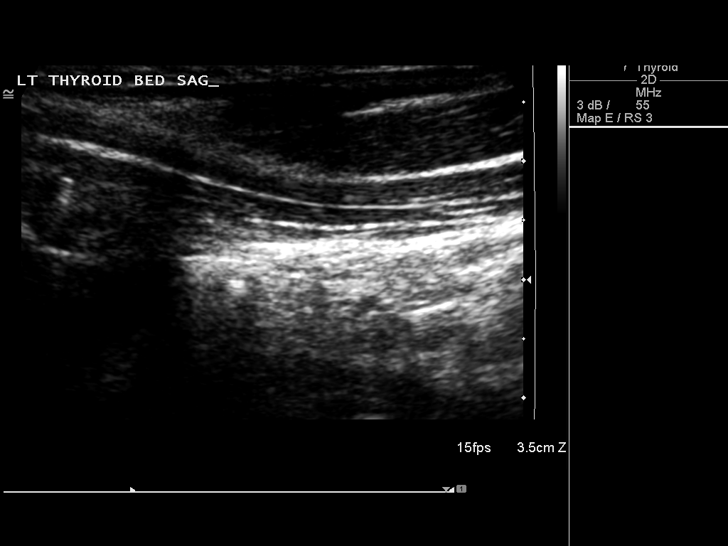
[im 11/17]
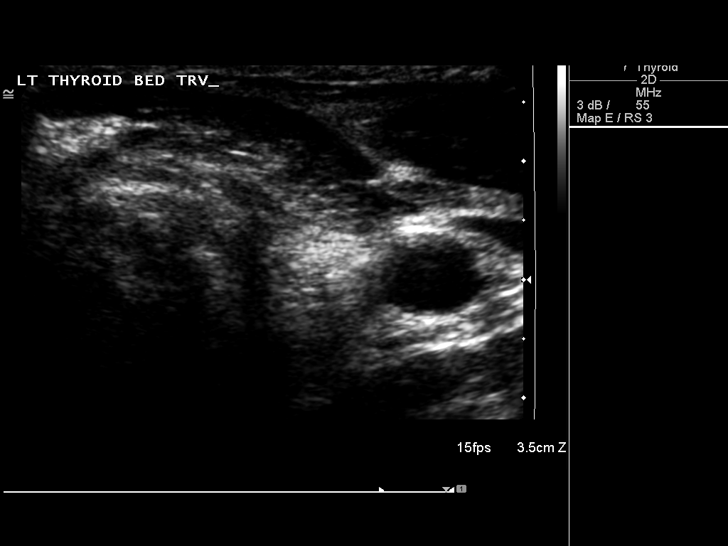
[im 12/17]
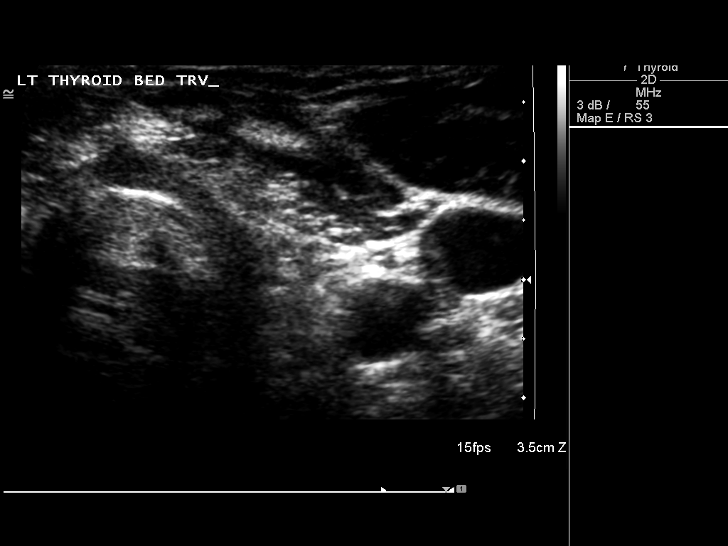
[im 13/17]
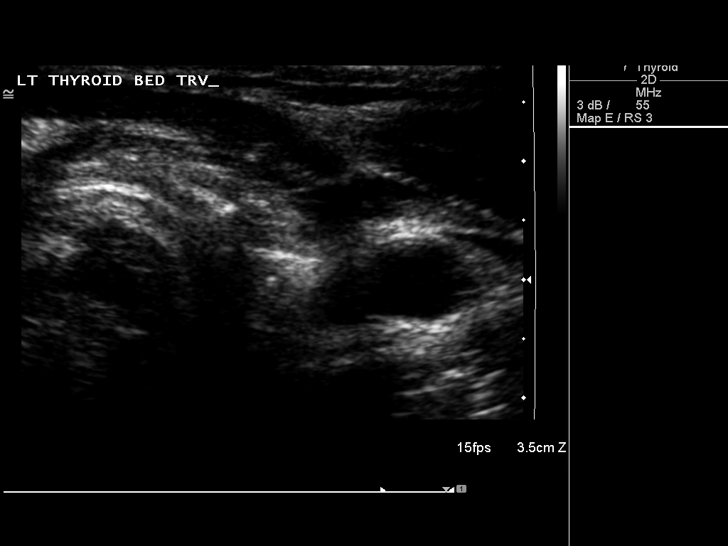
[im 14/17]
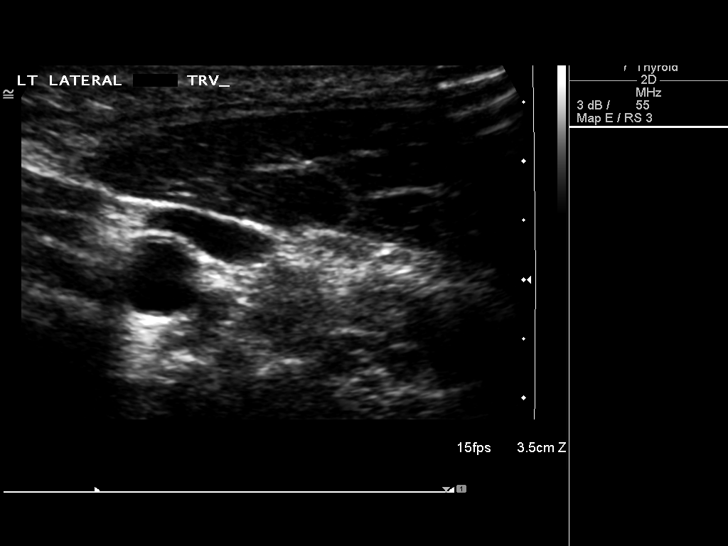
[im 16/17]
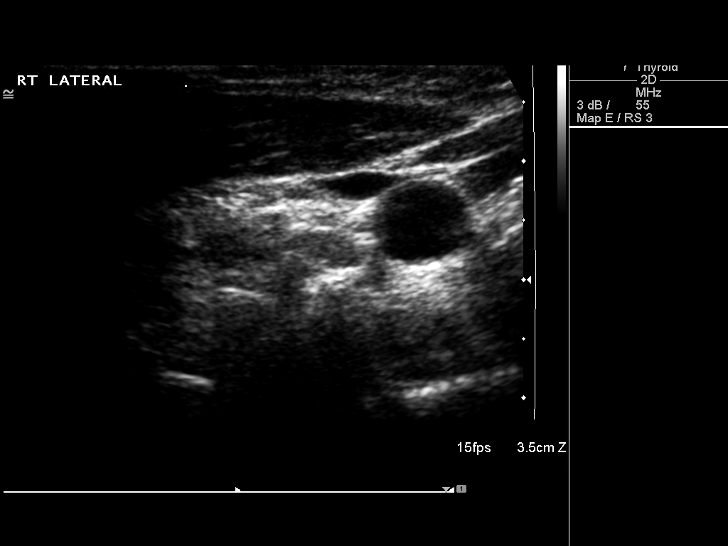
[im 17/17]
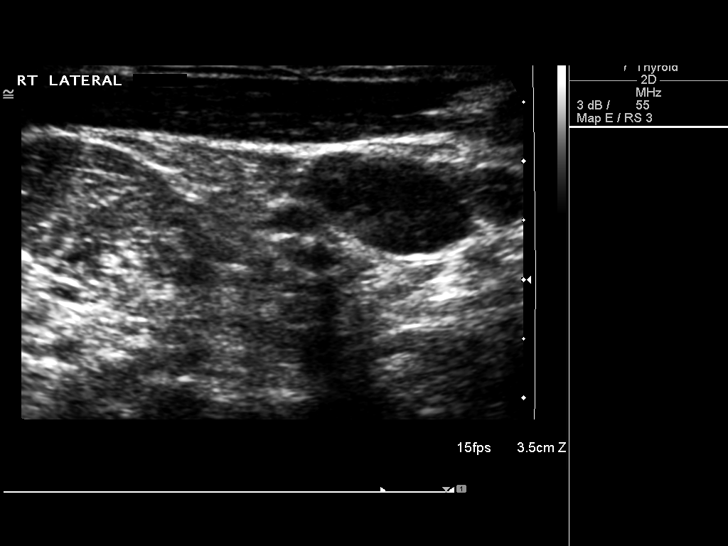

[14 of 17 positions shown; findings below may reference images not displayed]

FINDINGS: There has been bilateral thyroidectomy.  Ultrasound in
neck reveals no residual thyroid tissue.  No mass lesion is
identified.  No nodules are present.
IMPRESSION: No  residual thyroid tissue or mass is identified in the thyroid
bed.

## 2010-08-11 IMAGING — NM NM RAI THERAPY CANCER THYROID
1 series · 1 of 1 positions shown · non-contrast
Comparison: Post-therapy I 131 whole body scan 12/20/2008.

CLINICAL DATA: Thyroid cancer

RADIOACTIVE IODINE THERAPY FOR THYROID CANCER:

[st static image · 1 of 1 slices shown]
[im 1/1]
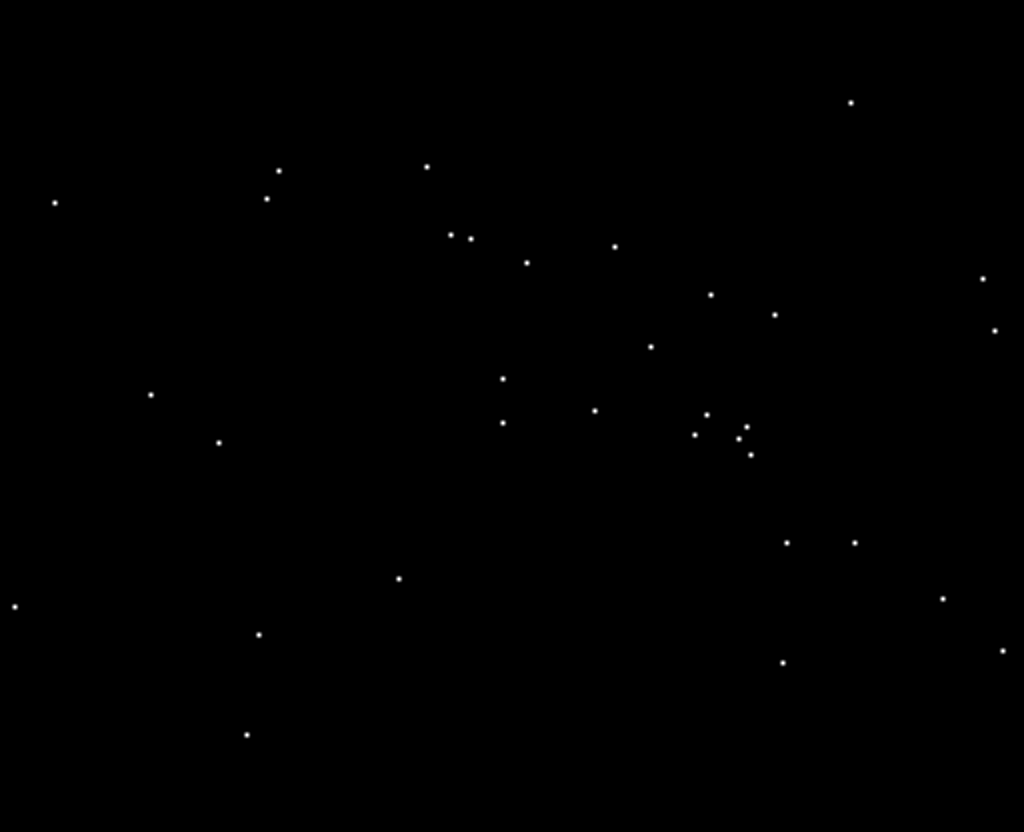

[1 of 1 positions shown; findings below may reference images not displayed]

Procedure:  The risks and benefits of radioactive iodine therapy
were discussed with the patient in detail.  Alternative therapies
were also mentioned.  Radiation safety was discussed with the
patient, including how to protect the general public from exposure.
There were no barriers to communication.  Written consent was
obtained.  The patient then received a capsule containing the
radiopharmaceutical.

The patient will follow-up with the referring physician.

Radiopharmaceutical:  203 mCi 7-Z6Z sodium iodide.
IMPRESSION: Per oral administration of 7-Z6Z sodium iodide for the treatment of
thyroid cancer.

## 2010-09-09 ENCOUNTER — Encounter: Payer: Self-pay | Admitting: Endocrinology

## 2010-11-03 ENCOUNTER — Encounter: Payer: Self-pay | Admitting: Endocrinology

## 2010-11-22 ENCOUNTER — Encounter: Payer: Self-pay | Admitting: Endocrinology

## 2010-11-23 ENCOUNTER — Ambulatory Visit
Admission: RE | Admit: 2010-11-23 | Discharge: 2010-11-23 | Payer: Self-pay | Source: Home / Self Care | Attending: Endocrinology | Admitting: Endocrinology

## 2010-11-23 ENCOUNTER — Encounter: Payer: Self-pay | Admitting: Endocrinology

## 2010-11-23 ENCOUNTER — Other Ambulatory Visit: Payer: Self-pay | Admitting: Endocrinology

## 2010-11-23 LAB — HEPATIC FUNCTION PANEL
ALT: 19 U/L (ref 0–53)
AST: 23 U/L (ref 0–37)
Albumin: 4.2 g/dL (ref 3.5–5.2)
Alkaline Phosphatase: 58 U/L (ref 39–117)
Bilirubin, Direct: 0.1 mg/dL (ref 0.0–0.3)
Total Bilirubin: 0.9 mg/dL (ref 0.3–1.2)
Total Protein: 6.6 g/dL (ref 6.0–8.3)

## 2010-11-23 LAB — CBC WITH DIFFERENTIAL/PLATELET
Basophils Absolute: 0 10*3/uL (ref 0.0–0.1)
Basophils Relative: 0.5 % (ref 0.0–3.0)
Eosinophils Absolute: 0 10*3/uL (ref 0.0–0.7)
Eosinophils Relative: 0.9 % (ref 0.0–5.0)
HCT: 43.2 % (ref 39.0–52.0)
Hemoglobin: 15.1 g/dL (ref 13.0–17.0)
Lymphocytes Relative: 32.3 % (ref 12.0–46.0)
Lymphs Abs: 1.5 10*3/uL (ref 0.7–4.0)
MCHC: 35 g/dL (ref 30.0–36.0)
MCV: 90.3 fl (ref 78.0–100.0)
Monocytes Absolute: 0.4 10*3/uL (ref 0.1–1.0)
Monocytes Relative: 8.3 % (ref 3.0–12.0)
Neutro Abs: 2.7 10*3/uL (ref 1.4–7.7)
Neutrophils Relative %: 58 % (ref 43.0–77.0)
Platelets: 128 10*3/uL — ABNORMAL LOW (ref 150.0–400.0)
RBC: 4.79 Mil/uL (ref 4.22–5.81)
RDW: 12.8 % (ref 11.5–14.6)
WBC: 4.6 10*3/uL (ref 4.5–10.5)

## 2010-11-23 LAB — BASIC METABOLIC PANEL
BUN: 21 mg/dL (ref 6–23)
CO2: 29 mEq/L (ref 19–32)
Calcium: 8.8 mg/dL (ref 8.4–10.5)
Chloride: 104 mEq/L (ref 96–112)
Creatinine, Ser: 0.9 mg/dL (ref 0.4–1.5)
GFR: 100.65 mL/min (ref >60.00)
Glucose, Bld: 80 mg/dL (ref 70–99)
Potassium: 5.1 mEq/L (ref 3.5–5.1)
Sodium: 139 mEq/L (ref 135–145)

## 2010-11-23 LAB — URINALYSIS
Bilirubin Urine: NEGATIVE
Hemoglobin, Urine: NEGATIVE
Ketones, ur: NEGATIVE
Leukocytes, UA: NEGATIVE
Nitrite: NEGATIVE
Specific Gravity, Urine: 1.015 (ref 1.000–1.030)
Total Protein, Urine: NEGATIVE
Urine Glucose: NEGATIVE
Urobilinogen, UA: 0.2 (ref 0.0–1.0)
pH: 6 (ref 5.0–8.0)

## 2010-11-23 LAB — LIPID PANEL
Cholesterol: 139 mg/dL (ref 0–200)
HDL: 49.8 mg/dL (ref >39.00)
LDL Cholesterol: 77 mg/dL (ref 0–99)
Total CHOL/HDL Ratio: 3
Triglycerides: 63 mg/dL (ref 0.0–149.0)
VLDL: 12.6 mg/dL (ref 0.0–40.0)

## 2010-11-23 LAB — PSA: PSA: 0.88 ng/mL (ref 0.10–4.00)

## 2010-11-23 LAB — TSH: TSH: 1.08 u[IU]/mL (ref 0.35–5.50)

## 2010-11-27 ENCOUNTER — Encounter: Payer: Self-pay | Admitting: Endocrinology

## 2010-11-27 ENCOUNTER — Other Ambulatory Visit: Payer: Self-pay | Admitting: Endocrinology

## 2010-11-27 ENCOUNTER — Ambulatory Visit
Admission: RE | Admit: 2010-11-27 | Discharge: 2010-11-27 | Payer: Self-pay | Source: Home / Self Care | Attending: Endocrinology | Admitting: Endocrinology

## 2010-11-27 LAB — AMYLASE: Amylase: 105 U/L (ref 27–131)

## 2010-11-30 LAB — CONVERTED CEMR LAB: Thyroglobulin Ab: <20

## 2010-12-01 NOTE — Progress Notes (Signed)
Summary: ABX change  Phone Note Call from Patient Call back at Home Phone [phone]   Caller: spouse- Summary of Call: Patient spouse left message on triage for [place] to call her in regards to ABX. She has spoken with ENT that the patient will see and per spouse the ENT requests that the patient be changed to Clindamycin 300mg  Sig: 1 by mouth qid x 10 days. ([facility]). Please advise. Initial call taken by: Lucious Groves,  December 15, 2009 9:52 AM  Follow-up for Phone Call        sent Follow-up by: Minus Breeding MD,  December 15, 2009 10:03 AM  Additional Follow-up for Phone Call Additional follow up Details #1::        pt's spouse informed Additional Follow-up by: Margaret Pyle, CMA,  December 15, 2009 10:07 AM    New/Updated Medications: CLINDAMYCIN HCL 300 MG CAPS (CLINDAMYCIN HCL) 1 qid Prescriptions: CLINDAMYCIN HCL 300 MG CAPS (CLINDAMYCIN HCL) 1 qid  #28 x 0   Entered and Authorized by:   Minus Breeding MD   Signed by:   Minus Breeding MD on 12/15/2009   Method used:   Electronically to        [facility]. [phone]* (retail)       [phone]       [place], [place]  [address]       Ph: [phone]       Fax: [phone]   RxID:   [id number]

## 2010-12-01 NOTE — Progress Notes (Signed)
Summary: LAB ORDER  ---- Converted from flag ---- ---- 01/30/2010 8:54 AM, Hilarie Fredrickson wrote: LABS ARE ENTERED IN IDX/ PT IS AWARE.  ---- 01/30/2010 8:32 AM, Minus Breeding MD wrote: tsh 244.0 and thyroglobulin panel 193  ---- 01/29/2010 4:43 PM, Hilarie Fredrickson wrote: [name] CALLED TO SEE IF HE NEEDS TO GO TO THE LAB BEFORE HIS APRIL 7 APPT.  PT INSTRUCTIONS FROM JAN 28 SAYS CK AMYLASE AND RE CK CBC WHICH HE ALSO DID ON 1-28.  WILL HE NEED TO REPEAT THESE?  WHAT IS THE DX CODE?  IF SO I'LL CALL HIM.   CELL [phone] ------------------------------  Phone Note Call from Patient Call back at Home Phone [phone]   Caller: Patient

## 2010-12-01 NOTE — Miscellaneous (Signed)
Summary: Immunization Entry   Immunization History:  Influenza Immunization History:    Influenza:  historical (09/08/2010)   Lot #:[id number] Inj. Site: Left Deltoid Exp Date: 05/01/2011 immunization given at [facility]  Brenton Grills CMA Duncan Dull)  September 09, 2010 3:22 PM

## 2010-12-01 NOTE — Assessment & Plan Note (Signed)
Summary: FU--D/T---STC   Vital Signs:  Patient profile:   45 year old male Height:      69 inches (175.26 cm) Weight:      156.25 pounds (71.02 kg) O2 Sat:      98 % on Room air Temp:     98.2 degrees F (36.78 degrees C) oral Pulse rate:   72 / minute BP sitting:   108 / 70  (left arm) Cuff size:   regular  Vitals Entered By: Josph Macho RMA (February 09, 2010 7:57 AM)  O2 Flow:  Room air CC: Follow-up visit/ pt states he is no longer taking Clindamycin/ CF Is Patient Diabetic? No   CC:  Follow-up visit/ pt states he is no longer taking Clindamycin/ CF.  History of Present Illness: pt says he does not notice any nodule in the neck. he takes synthroid as rx'ed.  Current Medications (verified): 1)  Lunesta 2 Mg Tabs (Eszopiclone) .... At [facility] As Needed Sleep 2)  Lipitor 20 Mg Tabs (Atorvastatin Calcium) .... Take 1 By Mouth Qd 3)  Synthroid 175 Mcg Tabs (Levothyroxine Sodium) .Marland Kitchen.. 1 Qd 4)  Clindamycin Hcl 300 Mg Caps (Clindamycin Hcl) .Marland Kitchen.. 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 9/10:  tg=43 (ab neg) 9/10:  US neck neg 9/10:  i-131 rx 203 [facility] 10/10:  post-therapy scan neg 4/11 tg undetectable (ab pos but normal). HEPATOTOXICITY, DRUG-INDUCED, RISK OF (ICD-V58.69) THYROID NODULE, LEFT (ICD-241.0) BACK PAIN (ICD-724.5)  Review of Systems       no neck pain  Physical Exam  General:  normal appearance.   Neck:  a healed scar is present.  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  Medications Added to Medication List This Visit: 1)  Levothyroxine Sodium 150 Mcg Tabs (Levothyroxine sodium) .Marland Kitchen.. 1 once daily  Other Orders: Est. Patient Level III (15[address])  Patient Instructions: 1)  reduce levothyroxine to 150 micrograms/day. 2)  blood tests in 3 months--thyroglogulin panel 193, tsh 244., and cbc 287.5 3)  physical jan 2012. Prescriptions: LEVOTHYROXINE SODIUM 150 MCG TABS (LEVOTHYROXINE SODIUM) 1 once daily  #30 x 11   Entered and Authorized by:   Minus Breeding MD   Signed by:   Minus Breeding MD on 02/09/2010   Method used:   Electronically to        [facility]. [phone]* (retail)       [phone]       [place], [place]  [address]       Ph: [phone]  Fax: [phone]   RxID:   [id number]

## 2010-12-01 NOTE — Assessment & Plan Note (Signed)
Summary: L FACE SWOLLEN--PER DAH--STC   Vital Signs:  Patient profile:   45 year old male Height:      69 inches (175.26 cm) Weight:      154.12 pounds (70.05 kg) O2 Sat:      99 % on Room air Temp:     98.3 degrees F (36.83 degrees C) oral Pulse rate:   56 / minute BP sitting:   132 / 90  (left arm) Cuff size:   regular  Vitals Entered By: Orlan Leavens (November 28, 2009 9:48 AM)  O2 Flow:  Room air CC: (L) side face swollen/ No changes on current meds Is Patient Diabetic? No Pain Assessment Patient in pain? no        CC:  (L) side face swollen/ No changes on current meds.  History of Present Illness: pt states 10 hrs of swelling at the left face.  the face is also painful, but there is no associated pain at the teeth.   he also notes leg cramps with sports activity. he was noted to have low plts earlier this month.  Current Medications (verified): 1)  Lunesta 2 Mg Tabs (Eszopiclone) .... At [facility] As Needed Sleep 2)  Lipitor 20 Mg Tabs (Atorvastatin Calcium) .... Take 1 By Mouth Qd 3)  Synthroid 175 Mcg Tabs (Levothyroxine Sodium) .Marland Kitchen.. 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         he has minimal brbpr  Physical Exam  Additional Exam:  White Cell Count          4.6 K/uL                    4.5-10.5   Red Cell Count            4.58 Mil/uL                 4.22-5.81   Hemoglobin                14.1 g/dL                   16.1-09.6   Hematocrit                42.8 %                      39.0-52.0  MCV                       93.4 fl                     78.0-100.0   MCHC                      32.9 g/dL                   04.5-40.9   RDW                       11.6 %  11.5-14.6   Platelet Count       [L]  149.0 K/uL                  150.0-400.0   Neutrophil %              69.9 %                      43.0-77.0   Lymphocyte %              20.0 %                      12.0-46.0   Monocyte %                9.1 %                       3.0-12.0   Eosinophils%              0.6 %                       0.0-5.0   Basophils %               0.4 %                       0.0-3.0   Neutrophill Absolute      3.3 K/uL                    1.4-7.7   Lymphocyte Absolute       0.9 K/uL                    0.7-4.0   Monocyte Absolute         0.4 K/uL                    0.1-1.0  Eosinophils, Absolute                             0.0 K/uL                    0.0-0.7   Basophils Absolute        0.0 K/uL                    0.0-0.1    Amylase              [H]  638 U/L      Impression & Recommendations:  Problem # 1:  PAROTITIS (ICD-527.2) Assessment New  Problem # 2:  THROMBOCYTOPENIA (ICD-287.5) Assessment: Improved  Problem # 3:  LEG CRAMPS (ICD-729.82) ? ca++-related  Medications Added to Medication List This Visit: 1)  Keflex 500 Mg Caps (Cephalexin) .Marland Kitchen.. 1 three times a day  Other Orders: T-Parathyroid Hormone, Intact w/ Calcium (51884-16606) TLB-CBC Platelet - w/Differential (85025-CBCD) TLB-Amylase (82150-AMYL) Est. Patient Level IV (30160)  Patient Instructions: 1)  cephalexin 500 mg three times a day. 2)  check amylase. 3)  recheck cbc. 4)  (update: i left message on phone-tree:  rx as we discussed.  we'll awair pth result) Prescriptions: LIPITOR 20 MG TABS (ATORVASTATIN CALCIUM) TAKE 1 by mouth QD  #30 x 11   Entered and Authorized by:   Minus Breeding MD   Signed by:   Minus Breeding  MD on 11/28/2009   Method used:   Electronically to        [facility].  [phone]* (retail)       [phone]       [place], [place]  [address]       Ph: [phone]       Fax: [phone]   RxID:   [id number] KEFLEX 500 MG CAPS (CEPHALEXIN) 1 three times a day  #21 x 0   Entered and Authorized by:   Minus Breeding MD   Signed by:   Minus Breeding MD on 11/28/2009   Method used:   Electronically to        [facility]. [phone]* (retail)       [phone]       [place], [place]  [address]       Ph: [phone]       Fax: [phone]   RxID:   [id number]

## 2010-12-01 NOTE — Progress Notes (Signed)
Summary: ABX request  Phone Note Call from Patient   Caller: Patient [phone] Summary of Call: pt called stating that infection has returned but to opposite side of the face. pt is requesting refill of ABX MD prescribed. Initial call taken by: Margaret Pyle, CMA,  December 12, 2009 5:00 PM  Follow-up for Phone Call        i refilled keflex refer ent Follow-up by: Minus Breeding MD,  December 12, 2009 5:04 PM    Prescriptions: KEFLEX 500 MG CAPS (CEPHALEXIN) 1 three times a day  #21 x 0   Entered and Authorized by:   Minus Breeding MD   Signed by:   Minus Breeding MD on 12/12/2009   Method used:   Electronically to        [facility]. [phone]* (retail)       [phone]       [place], [place]  [address]       Ph: [phone]       Fax: [phone]   RxID:   [id number]

## 2010-12-03 NOTE — Letter (Signed)
Summary: Velora Heckler MD  Velora Heckler MD   Imported By: Lester Kraemer 11/17/2010 10:13:20  _____________________________________________________________________  External Attachment:    Type:   Image     Comment:   External Document

## 2010-12-09 NOTE — Assessment & Plan Note (Signed)
Summary: cpx / bcbs / # cd   Vital Signs:  Patient profile:   45 year old male Height:      69 inches (175.26 cm) Weight:      156.50 pounds (71.14 kg) BMI:     23.19 O2 Sat:      98 % on Room air Temp:     98.4 degrees F (36.89 degrees C) oral Pulse rate:   55 / minute BP sitting:   108 / 70  (left arm) Cuff size:   regular  Vitals Entered By: Brenton Grills CMA Duncan Dull) (November 27, 2010 8:55 AM)  O2 Flow:  Room air CC: CPX/aj Is Patient Diabetic? No   CC:  CPX/aj.  History of Present Illness: here for regular wellness examination.  He's feeling pretty well in general, and does not drink or smoke.    Current Medications (verified): 1)  Lunesta 2 Mg Tabs (Eszopiclone) .... At [facility] As Needed Sleep 2)  Lipitor 20 Mg Tabs (Atorvastatin Calcium) .... Take 1 By Mouth Qd 3)  Levothyroxine Sodium 137 Mcg Tabs (Levothyroxine Sodium) .Marland Kitchen.. 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 9/10:  tg=43 (ab neg) 9/10:  US neck neg 9/10:  i-131 rx 203 [facility] 10/10:  post-therapy scan neg 4/11 tg undetectable (ab pos but normal). 7/11: tg undetectable (ab pos but normal) HEPATOTOXICITY, DRUG-INDUCED, RISK OF (ICD-V58.69) THYROID NODULE, LEFT (ICD-241.0) BACK PAIN (ICD-724.5)  Family History: Reviewed history from 08/21/2008 and no changes required. no cancer both parents are living  Social History: Reviewed history from 10/15/2009 and no changes required. 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         insomnia is well-controlled.  Physical Exam  General:  normal appearance.   Head:  head: no deformity eyes: no periorbital swelling, no proptosis external nose and ears are normal mouth: no lesion seen Neck:  a healed scar is present.  i do not appreciate a nodule in the thyroid or elsewhere in the neck  Lungs:  Clear to auscultation bilaterally. Normal respiratory effort.  Heart:  Regular rate and rhythm without murmurs or gallops noted. Normal S1,S2.   Abdomen:  abdomen is soft, nontender.  no hepatosplenomegaly.   not distended.  no hernia  Genitalia:  Normal external male genitalia with no urethral discharge.  Msk:  muscle bulk and strength are grossly normal.  no obvious joint swelling.  gait is normal and steady  Pulses:  dorsalis pedis intact bilat.  no carotid bruit  Extremities:  no deformity.  no ulcer on the feet.  feet are of normal color and temp.  no edema  Neurologic:  cn 2-12 grossly intact.   readily moves all 4's.   sensation is intact to touch on the feet  Skin:  normal texture and temp.  no rash.  not diaphoretic  Cervical Nodes:  No significant adenopathy.  Psych:  Alert and cooperative; normal mood and affect; normal attention span and concentration.  Impression & Recommendations:  Problem # 1:  ROUTINE GENERAL MEDICAL EXAM@HEALTH  CARE FACL (ICD-V70.0)  Medications Added to Medication List This Visit: 1)  Aleve 220 Mg Tabs (Naproxen sodium) .Marland Kitchen.. 1 tab once daily  Other Orders: T-Thyroglobulin Panel ([address], [phone]) EKG w/ Interpretation (93000) TLB-Amylase (82150-AMYL) Est. Patient 40-64 years (7[address]69)  Patient Instructions: 1)  please consider these measures for your health:  minimize alcohol.  do not use tobacco products.  have a colonoscopy at least every 10 years from age 62.  keep firearms safely  stored.  always use seat belts.  have working smoke alarms in your home.  see an eye doctor and dentist regularly.  never drive under the influence of alcohol or drugs (including prescription drugs).  those with fair skin should take precautions against the sun. 2)  please let me know what your wishes would be, if artificial life support measures should become necessary.  it is critically important to prevent falling down (keep floor areas well-lit, dry, and free of loose objects). 3)  blood tests are being ordered for you today.  please call [phone] to hear your test results. 4)  Please schedule a follow-up appointment in 6 months.   Orders Added: 1)  T-Thyroglobulin Panel [id number], [id number]] 2)  EKG w/ Interpretation [93000] 3)  TLB-Amylase [82150-AMYL] 4)  Est. Patient 40-64 years [25366]

## 2011-03-16 NOTE — Op Note (Signed)
[name], [name] NO.:  [id number]   MEDICAL RECORD NO.:  [id number]          PATIENT TYPE:  OIB   LOCATION:  5118                         FACILITY:  [facility]   PHYSICIAN:  Velora Heckler, MD      DATE OF BIRTH:  January 09, 1966   DATE OF PROCEDURE:  09/19/2008  DATE OF DISCHARGE:                               OPERATIVE REPORT   PREOPERATIVE DIAGNOSIS:  Left thyroid nodule with atypia.   POSTOPERATIVE DIAGNOSIS:  Left thyroid nodule with atypia.   PROCEDURE:  1. Total thyroidectomy.  2. Central compartment (zone VI) lymph node dissection.   SURGEON:  Velora Heckler, MD, FACS   ASSISTANT:  Gabrielle Dare. Janee Morn, MD, FACS   ANESTHESIA:  General.   ESTIMATED BLOOD LOSS:  Minimal.   PREPARATION:  Betadine.   COMPLICATIONS:  None.   INDICATIONS:  The patient is a 45 year old white male from [place],  [place].  The patient was found on routine physical exam by Dr.  Romero Belling to have a left thyroid nodule.  Ultrasound demonstrated a  1.9-cm nodule in the left lobe.  This was solid and hypervascular  containing microcalcifications.  Fine-needle aspiration showed atypical  cells, highly suspicious for papillary carcinoma.  The patient now comes  to surgery for resection.   BODY OF REPORT:  Procedure was done in OR #17 at the [facility].  The patient was brought to the operating room and  placed in supine position on the operating room table.  Following  administration of general anesthesia, the patient was positioned and  then prepped and draped in the usual strict aseptic fashion.  After  ascertaining that an adequate level of anesthesia had been achieved, a  Kocher incision was made with a #15 blade.  Dissection was carried  through subcutaneous tissues and platysma.  Hemostasis was obtained with  the electrocautery.  Skin flaps were elevated cephalad and caudad from  the thyroid notch to the sternal notch.  A Mahorner  self-retaining  retractor was placed for exposure.  Strap muscles were incised in the  midline and dissection was begun on the left side.  Strap muscles were  reflected laterally.  Left thyroid lobe was gently dissected out.  Venous tributaries were divided between small and medium Ligaclips with  the harmonic scalpel.  Superior pole vessels were dissected out.  Vessels were divided between medium Ligaclips with the harmonic scalpel.  Superior parathyroid gland was identified and preserved on its vascular  pedicle.  Gland was rolled anteriorly.  Branches of the inferior thyroid  artery were divided between small and medium Ligaclips with the harmonic  scalpel.  Inferior venous tributaries were divided between medium  Ligaclips with the harmonic scalpel.  Recurrent laryngeal nerve was  identified and preserved.  Ligament of Allyson Sabal was transected and the  gland was mobilized anteriorly.  There appears to be a small pyramidal  lobe which was also mobilized off the thyroid cartilage with the  electrocautery and included with the specimen.  Isthmus was mobilized  across the midline.  Dry pack was placed  in the left neck.   Next, we turned our attention to the right thyroid lobe.  Again, strap  muscles were reflected laterally and the right lobe was exposed.  Venous  tributaries were divided between small and medium Ligaclips with the  harmonic scalpel.  Superior pole vessels were dissected out and divided  between medium Ligaclips with the harmonic scalpel.  Gland was rolled  anteriorly.  Inferior venous tributaries were divided between medium  Ligaclips with the harmonic scalpel.  Superior parathyroid gland was  dissected out and maintained on its vascular pedicle.  Branches of the  inferior thyroid artery were divided between small Ligaclips with the  harmonic scalpel.  Ligament of Allyson Sabal was transected.  Recurrent  laryngeal nerve was identified and preserved.  Gland was excised off the   anterior trachea with the electrocautery.  The entire specimen was  submitted to pathology.  A suture was used to mark the right superior  pole.  A dominant mass was noted in the left superior pole.   Next, the central compartment zone 6 lymph nodes were dissected out.  There was no gross adenopathy.  There was some thymic tissue evident.  Venous tributaries were divided with harmonic scalpel between Ligaclips.  Sample was completely excised from carotid artery to carotid artery and  down to the level of the innominate.  It was submitted separately to  pathology for review.  Neck was irrigated with warm saline which was  evacuated.  Good hemostasis was noted.  Surgicel was placed in the  operative field bilaterally.  Strap muscles were reapproximated in the  midline with interrupted 3-0 Vicryl sutures.  Platysma was closed with  interrupted 3-0 Vicryl sutures.  Skin was closed with a running 4-0  Monocryl subcuticular suture.  Wound was washed and dried and Benzoin  and Steri-Strips were applied.  Sterile dressings were applied.  The  patient was awakened from anesthesia and brought to the recovery room in  stable condition.  The patient tolerated the procedure well.      Velora Heckler, MD  Electronically Signed     TMG/MEDQ  D:  09/19/2008  T:  09/20/2008  Job:  [id number]   cc:   Gregary Signs A. Everardo All, MD

## 2011-05-11 ENCOUNTER — Other Ambulatory Visit: Payer: Self-pay | Admitting: Endocrinology

## 2011-06-17 ENCOUNTER — Other Ambulatory Visit: Payer: Self-pay | Admitting: Internal Medicine

## 2011-06-18 NOTE — Telephone Encounter (Signed)
Faxed script back to [facility] @ [phone].Marland KitchenMarland Kitchen8/17/12@1 :18pm/LMB

## 2011-07-06 ENCOUNTER — Telehealth: Payer: Self-pay | Admitting: Endocrinology

## 2011-07-06 DIAGNOSIS — C73 Malignant neoplasm of thyroid gland: Secondary | ICD-10-CM

## 2011-07-06 DIAGNOSIS — D696 Thrombocytopenia, unspecified: Secondary | ICD-10-CM

## 2011-07-06 DIAGNOSIS — E890 Postprocedural hypothyroidism: Secondary | ICD-10-CM

## 2011-07-06 NOTE — Telephone Encounter (Signed)
Message copied by Romero Belling on Tue Jul 06, 2011  5:26 PM ------      Message from: Carin Primrose      Created: Tue Jul 06, 2011  3:39 PM      Regarding: RE: labs       PT was here for CPX in January, what labs will he need for upcoming appointment.       ----- Message -----         From: Fuller Canada         Sent: 07/06/2011   8:10 AM           To: Brenton Grills, CMA      Subject: labs                                                     This pt is coming in for a follow up and is under the impression he is suppose to get lab work done before hand.  i might be looking at it wrong, but I didn't see lab work orders in.  I told him to call back this afternoon or tomorrow to see if lab work needs to be scheduled.  (he said his only available day to do labs would be tomorrow, Thursday, Friday)                   Thanks so much!!

## 2011-07-06 NOTE — Telephone Encounter (Signed)
i ordered

## 2011-07-07 NOTE — Telephone Encounter (Signed)
Pt informed lab orders in and that he can come in before appt to have labs drawn.

## 2011-07-08 ENCOUNTER — Other Ambulatory Visit (INDEPENDENT_AMBULATORY_CARE_PROVIDER_SITE_OTHER): Payer: Self-pay

## 2011-07-08 DIAGNOSIS — C73 Malignant neoplasm of thyroid gland: Secondary | ICD-10-CM

## 2011-07-08 DIAGNOSIS — D696 Thrombocytopenia, unspecified: Secondary | ICD-10-CM

## 2011-07-08 DIAGNOSIS — E890 Postprocedural hypothyroidism: Secondary | ICD-10-CM

## 2011-07-08 LAB — CBC WITH DIFFERENTIAL/PLATELET
Basophils Absolute: 0 10*3/uL (ref 0.0–0.1)
HCT: 44.5 % (ref 39.0–52.0)
Lymphs Abs: 1.2 10*3/uL (ref 0.7–4.0)
Monocytes Relative: 7.9 % (ref 3.0–12.0)
Platelets: 139 10*3/uL — ABNORMAL LOW (ref 150.0–400.0)
RDW: 12.7 % (ref 11.5–14.6)

## 2011-07-08 LAB — TSH: TSH: 1.13 u[IU]/mL (ref 0.35–5.50)

## 2011-07-09 LAB — ANTI-THYROGLOBULIN ANTIBODY: Thyroglobulin Ab: <20 U/mL (ref <40.0)

## 2011-07-16 ENCOUNTER — Encounter: Payer: Self-pay | Admitting: Endocrinology

## 2011-07-16 ENCOUNTER — Ambulatory Visit (INDEPENDENT_AMBULATORY_CARE_PROVIDER_SITE_OTHER): Payer: BC Managed Care – PPO | Admitting: Endocrinology

## 2011-07-16 VITALS — BP 116/68 | HR 58 | Temp 98.1°F | Ht 68.0 in | Wt 158.6 lb

## 2011-07-16 DIAGNOSIS — E890 Postprocedural hypothyroidism: Secondary | ICD-10-CM

## 2011-07-16 MED ORDER — LEVOTHYROXINE SODIUM 150 MCG PO TABS: 150.0000 ug | ORAL_TABLET | Freq: Every day | ORAL | Status: DC

## 2011-07-16 NOTE — Progress Notes (Signed)
  Subjective:    Patient ID: [name], male    DOB: [birth year], 45 y.o.   MRN: [id number]  HPI Pt states few mos of slight nodule in the left scrotum.  No assoc pain. Past Medical History  Diagnosis Date  . Prostatitis 2001  . Cramp of limb   . Postsurgical hypothyroidism   . Malignant neoplasm of thyroid gland   . Encounter for long-term (current) use of other medications   . Nontoxic uninodular goiter Left  . Backache, unspecified     No past surgical history on file.  History   Social History  . Marital Status: Married    Spouse Name: N/A    Number of Children: N/A  . Years of Education: N/A   Occupational History  . Not on file.   Social History Main Topics  . Smoking status: Never Smoker   . Smokeless tobacco: Not on file   Comment: Frequent travel related to job. Spouse is a GYN PA  . Alcohol Use:   . Drug Use:   . Sexually Active:    Other Topics Concern  . Not on file   Social History Narrative  . No narrative on file    Current Outpatient Prescriptions on File Prior to Visit  Medication Sig Dispense Refill  . atorvastatin (LIPITOR) 20 MG tablet Take 20 mg by mouth daily.        Alfonso Patten 2 MG TABS take 1 tablet by mouth at bedtime if needed for sleep  30 tablet  4    No Known Allergies  Family History  Problem Relation Age of Onset  . Cancer Neg Hx    BP 116/68  Pulse 58  Temp(Src) 98.1 F (36.7 C) (Oral)  Ht 5\' 8"  (1.727 m)  Wt 158 lb 9.6 oz (71.94 kg)  BMI 24.11 kg/m2  SpO2 97%  Review of Systems Denies weight change and neck nodule    Objective:   Physical Exam VITAL SIGNS:  See vs page GENERAL: no distress Neck: a healed scar is present.  i do not appreciate a nodule in the thyroid or elsewhere in the neck GENITALIA:  Normal male testicles, scrotum, and penis.  No scrotal nodule is noted. 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  Presumed to be small in view of the fact that i can't palpate it.

## 2011-07-16 NOTE — Patient Instructions (Addendum)
Increase synthroid to 150 mcg/day.  i have sent a prescription to your pharmacy. In approx 6 weeks, recheck the tsh blood test.  Then please call [phone] to hear your test results.  You will be prompted to enter the 9-digit "MRN" number that appears at the top left of this page, followed by #.  Then you will hear the message. Please make a follow-up appointment in 6 months. Please call if the nodule in the testicle gets bigger.

## 2011-08-03 LAB — URINALYSIS, ROUTINE W REFLEX MICROSCOPIC
Bilirubin Urine: NEGATIVE
Glucose, UA: NEGATIVE
Hgb urine dipstick: NEGATIVE
Protein, ur: NEGATIVE
Specific Gravity, Urine: 1.015
Urobilinogen, UA: 0.2

## 2011-08-03 LAB — DIFFERENTIAL
Basophils Relative: 1
Eosinophils Absolute: 0
Lymphs Abs: 1.5
Monocytes Absolute: 0.3
Monocytes Relative: 5

## 2011-08-03 LAB — COMPREHENSIVE METABOLIC PANEL
ALT: 18
Albumin: 3.9
Alkaline Phosphatase: 52
GFR calc Af Amer: >60
Potassium: 4.7
Sodium: 138
Total Protein: 6.3

## 2011-08-03 LAB — CALCIUM: Calcium: 8.2 — ABNORMAL LOW

## 2011-08-03 LAB — CBC
Hemoglobin: 15.3
Platelets: 148 — ABNORMAL LOW
RDW: 12.4

## 2011-08-03 LAB — PROTIME-INR: INR: 0.9

## 2011-08-27 ENCOUNTER — Other Ambulatory Visit (INDEPENDENT_AMBULATORY_CARE_PROVIDER_SITE_OTHER): Payer: BC Managed Care – PPO

## 2011-08-27 DIAGNOSIS — E890 Postprocedural hypothyroidism: Secondary | ICD-10-CM

## 2011-08-27 LAB — TSH: TSH: 0.34 u[IU]/mL — ABNORMAL LOW (ref 0.35–5.50)

## 2011-11-04 ENCOUNTER — Other Ambulatory Visit: Payer: Self-pay | Admitting: Endocrinology

## 2011-11-05 ENCOUNTER — Ambulatory Visit (INDEPENDENT_AMBULATORY_CARE_PROVIDER_SITE_OTHER): Payer: BC Managed Care – PPO | Admitting: Endocrinology

## 2011-11-05 ENCOUNTER — Encounter: Payer: Self-pay | Admitting: Endocrinology

## 2011-11-05 DIAGNOSIS — M25561 Pain in right knee: Secondary | ICD-10-CM

## 2011-11-05 DIAGNOSIS — M25569 Pain in unspecified knee: Secondary | ICD-10-CM

## 2011-11-05 MED ORDER — CLONAZEPAM 0.5 MG PO TABS: 0.5000 mg | ORAL_TABLET | Freq: Every evening | ORAL | Status: DC | PRN

## 2011-11-05 NOTE — Progress Notes (Signed)
  Subjective:    Patient ID: [name], male    DOB: 17-Sep-1966, 46 y.o.   MRN: [id number]  HPI Pt has had insomnia for years.  He notes a decreasing effect of lunesta, despite the fact that he does not take it nightly.  He says he can fall asleep, but he re-awakens in the middle of the night.  He says travel in a minor contributor to the sxs.  He works on sleep hygiene, but these measures do not help.  Aside from mild work-related anxiety, he is unable to cite any precip factor for these sxs.   He reports 6 weeks of slight nodule at the right preauricular area, and slight assoc pain.   Past Medical History  Diagnosis Date  . Prostatitis 2001  . Cramp of limb   . Postsurgical hypothyroidism   . Malignant neoplasm of thyroid gland   . Encounter for long-term (current) use of other medications   . Nontoxic uninodular goiter Left  . Backache, unspecified     No past surgical history on file.  History   Social History  . Marital Status: Married    Spouse Name: N/A    Number of Children: N/A  . Years of Education: N/A   Occupational History  . Not on file.   Social History Main Topics  . Smoking status: Never Smoker   . Smokeless tobacco: Not on file   Comment: Frequent travel related to job. Spouse is a GYN PA  . Alcohol Use:   . Drug Use:   . Sexually Active:    Other Topics Concern  . Not on file   Social History Narrative  . No narrative on file    Current Outpatient Prescriptions on File Prior to Visit  Medication Sig Dispense Refill  . atorvastatin (LIPITOR) 20 MG tablet take 1 tablet by mouth once daily  30 tablet  8  . levothyroxine (SYNTHROID) 150 MCG tablet Take 1 tablet (150 mcg total) by mouth daily.  90 tablet  3    No Known Allergies  Family History  Problem Relation Age of Onset  . Cancer Neg Hx     BP 118/72  Pulse 65  Temp(Src) 97.8 F (36.6 C) (Oral)  Ht 5\' 8"  (1.727 m)  Wt 156 lb 3.2 oz (70.852 kg)  BMI 23.75 kg/m2  SpO2  97%  Review of Systems Denies weight change.  He has 2 mos of right knee pain.  No injury.      Objective:   Physical Exam VITAL SIGNS:  See vs page GENERAL: no distress PSYCH: Alert and oriented x 3.  Does not appear anxious nor depressed.     Assessment & Plan:  Preauricular nodule, very unlikely to be harmful Knee pain, in an active athlete, new Insomnia.  No evidence of other underlying sleep disorder.

## 2011-11-05 NOTE — Patient Instructions (Addendum)
Here is a prescription for "klonopin." I hope you feel better soon.  If you don't, please call back.   Please see dr Jerl Santos for your knee pain Call if the nodule at your right ear gets bigger.

## 2011-11-06 DIAGNOSIS — M25561 Pain in right knee: Secondary | ICD-10-CM | POA: Insufficient documentation

## 2012-03-21 ENCOUNTER — Other Ambulatory Visit: Payer: Self-pay | Admitting: Internal Medicine

## 2012-03-21 ENCOUNTER — Other Ambulatory Visit: Payer: Self-pay | Admitting: Endocrinology

## 2012-03-22 NOTE — Telephone Encounter (Signed)
Please advise-last written 11/05/2011 #30 with 3 refills.

## 2012-03-22 NOTE — Telephone Encounter (Signed)
rx was faxed bck to pharmacy by [facility]... 03/21/12@10 :21am/LMB

## 2012-03-22 NOTE — Telephone Encounter (Signed)
Rx faxed to [facility] pharmacy.  

## 2012-05-30 ENCOUNTER — Telehealth: Payer: Self-pay | Admitting: Endocrinology

## 2012-05-30 DIAGNOSIS — C73 Malignant neoplasm of thyroid gland: Secondary | ICD-10-CM

## 2012-05-30 DIAGNOSIS — Z0000 Encounter for general adult medical examination without abnormal findings: Secondary | ICD-10-CM

## 2012-05-30 DIAGNOSIS — E890 Postprocedural hypothyroidism: Secondary | ICD-10-CM

## 2012-05-30 DIAGNOSIS — D696 Thrombocytopenia, unspecified: Secondary | ICD-10-CM

## 2012-05-30 DIAGNOSIS — Z79899 Other long term (current) drug therapy: Secondary | ICD-10-CM

## 2012-05-30 NOTE — Telephone Encounter (Signed)
Do you also want routine labs?

## 2012-05-30 NOTE — Telephone Encounter (Signed)
Pt would like thyroid labs checked before 06/07/2012 appointment.

## 2012-05-30 NOTE — Telephone Encounter (Signed)
Left message for pt to callback office to inform us whether or not he would like routine labs as well.

## 2012-05-31 DIAGNOSIS — Z0000 Encounter for general adult medical examination without abnormal findings: Secondary | ICD-10-CM | POA: Insufficient documentation

## 2012-05-31 DIAGNOSIS — Z79899 Other long term (current) drug therapy: Secondary | ICD-10-CM | POA: Insufficient documentation

## 2012-05-31 NOTE — Telephone Encounter (Signed)
Yes

## 2012-05-31 NOTE — Telephone Encounter (Signed)
Pt informed via VM to come in for lab work before appointment.

## 2012-05-31 NOTE — Telephone Encounter (Signed)
i ordered

## 2012-06-07 ENCOUNTER — Ambulatory Visit: Payer: BC Managed Care – PPO | Admitting: Endocrinology

## 2012-06-27 ENCOUNTER — Other Ambulatory Visit (INDEPENDENT_AMBULATORY_CARE_PROVIDER_SITE_OTHER): Payer: BC Managed Care – PPO

## 2012-06-27 DIAGNOSIS — D696 Thrombocytopenia, unspecified: Secondary | ICD-10-CM

## 2012-06-27 DIAGNOSIS — C73 Malignant neoplasm of thyroid gland: Secondary | ICD-10-CM

## 2012-06-27 DIAGNOSIS — Z79899 Other long term (current) drug therapy: Secondary | ICD-10-CM

## 2012-06-27 DIAGNOSIS — E890 Postprocedural hypothyroidism: Secondary | ICD-10-CM

## 2012-06-27 DIAGNOSIS — Z0000 Encounter for general adult medical examination without abnormal findings: Secondary | ICD-10-CM

## 2012-06-27 LAB — URINALYSIS, ROUTINE W REFLEX MICROSCOPIC
Bilirubin Urine: NEGATIVE
Hgb urine dipstick: NEGATIVE
Ketones, ur: NEGATIVE
Leukocytes, UA: NEGATIVE
Nitrite: NEGATIVE
Urobilinogen, UA: 0.2 (ref 0.0–1.0)
pH: 6 (ref 5.0–8.0)

## 2012-06-27 LAB — CBC WITH DIFFERENTIAL/PLATELET
Basophils Relative: 0.5 % (ref 0.0–3.0)
Eosinophils Absolute: 0 10*3/uL (ref 0.0–0.7)
Hemoglobin: 15.2 g/dL (ref 13.0–17.0)
Lymphocytes Relative: 31.1 % (ref 12.0–46.0)
Monocytes Relative: 9.3 % (ref 3.0–12.0)
Neutro Abs: 2.6 10*3/uL (ref 1.4–7.7)
Neutrophils Relative %: 58.4 % (ref 43.0–77.0)
RBC: 4.95 Mil/uL (ref 4.22–5.81)
WBC: 4.5 10*3/uL (ref 4.5–10.5)

## 2012-06-27 LAB — HEPATIC FUNCTION PANEL
AST: 22 U/L (ref 0–37)
Albumin: 3.8 g/dL (ref 3.5–5.2)
Alkaline Phosphatase: 50 U/L (ref 39–117)
Bilirubin, Direct: 0.1 mg/dL (ref 0.0–0.3)
Total Protein: 6.5 g/dL (ref 6.0–8.3)

## 2012-06-27 LAB — LIPID PANEL
LDL Cholesterol: 89 mg/dL (ref 0–99)
Total CHOL/HDL Ratio: 3
VLDL: 11.4 mg/dL (ref 0.0–40.0)

## 2012-06-27 LAB — BASIC METABOLIC PANEL
BUN: 17 mg/dL (ref 6–23)
Calcium: 8.8 mg/dL (ref 8.4–10.5)
Creatinine, Ser: 1 mg/dL (ref 0.4–1.5)
GFR: 87.2 mL/min (ref >60.00)
Potassium: 4.6 mEq/L (ref 3.5–5.1)

## 2012-06-28 LAB — THYROGLOBULIN LEVEL: Thyroglobulin: <0.2 ng/mL (ref 0.0–55.0)

## 2012-06-30 ENCOUNTER — Other Ambulatory Visit: Payer: Self-pay | Admitting: *Deleted

## 2012-06-30 ENCOUNTER — Ambulatory Visit (INDEPENDENT_AMBULATORY_CARE_PROVIDER_SITE_OTHER): Payer: BC Managed Care – PPO | Admitting: Endocrinology

## 2012-06-30 ENCOUNTER — Encounter: Payer: Self-pay | Admitting: Endocrinology

## 2012-06-30 ENCOUNTER — Other Ambulatory Visit: Payer: Self-pay | Admitting: Endocrinology

## 2012-06-30 VITALS — BP 112/70 | HR 56 | Temp 97.7°F | Ht 68.0 in | Wt 148.0 lb

## 2012-06-30 DIAGNOSIS — Z0000 Encounter for general adult medical examination without abnormal findings: Secondary | ICD-10-CM

## 2012-06-30 MED ORDER — ATORVASTATIN CALCIUM 20 MG PO TABS: ORAL_TABLET | ORAL | Status: DC

## 2012-06-30 NOTE — Progress Notes (Signed)
Subjective:    Patient ID: [name], male    DOB: 1966-05-05, 46 y.o.   MRN: [id number]  HPI here for regular wellness examination.  He's feeling pretty well in general, and says chronic med probs are stable, except as noted below. Past Medical History  Diagnosis Date  . Prostatitis 2001  . Cramp of limb   . Postsurgical hypothyroidism   . Malignant neoplasm of thyroid gland   . Encounter for long-term (current) use of other medications   . Nontoxic uninodular goiter Left  . Backache, unspecified     No past surgical history on file.  History   Social History  . Marital Status: Married    Spouse Name: N/A    Number of Children: N/A  . Years of Education: N/A   Occupational History  . Not on file.   Social History Main Topics  . Smoking status: Never Smoker   . Smokeless tobacco: Not on file   Comment: Frequent travel related to job. Spouse is a GYN PA  . Alcohol Use:   . Drug Use:   . Sexually Active:    Other Topics Concern  . Not on file   Social History Narrative  . No narrative on file    Current Outpatient Prescriptions on File Prior to Visit  Medication Sig Dispense Refill  . atorvastatin (LIPITOR) 20 MG tablet take 1 tablet by mouth once daily  30 tablet  8  . clonazePAM (KLONOPIN) 0.5 MG tablet take 1 tablet by mouth at bedtime if needed for sleep  30 tablet  3  . levothyroxine (SYNTHROID) 150 MCG tablet Take 1 tablet (150 mcg total) by mouth daily.  90 tablet  3  . LUNESTA 2 MG TABS take 1 tablet by mouth at bedtime if needed for sleep  30 tablet  4  . DISCONTD: clonazePAM (KLONOPIN) 0.5 MG tablet Take 1 tablet (0.5 mg total) by mouth at bedtime as needed (sleep).  30 tablet  3    No Known Allergies  Family History  Problem Relation Age of Onset  . Cancer Neg Hx     BP 112/70  Pulse 56  Temp 97.7 F (36.5 C) (Oral)  Ht 5\' 8"  (1.727 m)  Wt 148 lb (67.132 kg)  BMI 22.50 kg/m2  SpO2 98%    Review of Systems  Constitutional:  Negative for fever.       He has lost a few lbs, due to his efforts  HENT: Negative for hearing loss.   Eyes: Negative for visual disturbance.  Respiratory: Negative for shortness of breath.   Cardiovascular: Negative for chest pain.  Gastrointestinal: Negative for anal bleeding.  Genitourinary: Negative for hematuria.  Musculoskeletal: Negative for back pain.  Skin: Negative for rash.  Neurological: Negative for syncope, numbness and headaches.  Hematological: Does not bruise/bleed easily.  Psychiatric/Behavioral: Negative for dysphoric mood.       Objective:   Physical Exam VS: see vs page GEN: no distress HEAD: head: no deformity eyes: no periorbital swelling, no proptosis external nose and ears are normal mouth: no lesion seen Neck: a healed scar is present.  i do not appreciate a nodule in the thyroid or elsewhere in the neck CHEST WALL: no deformity LUNGS: clear to auscultation BREASTS:  No gynecomastia CV: reg rate and rhythm, no murmur ABD: abdomen is soft, nontender.  no hepatosplenomegaly.  not distended.  no hernia.  MUSCULOSKELETAL: muscle bulk and strength are grossly normal.  no obvious joint swelling.  gait is normal and steady EXTEMITIES: no deformity.  no ulcer on the feet.  feet are of normal color and temp.  no edema PULSES: dorsalis pedis intact bilat.  no carotid bruit NEURO:  cn 2-12 grossly intact.   readily moves all 4's.  sensation is intact to touch on the feet SKIN:  Normal texture and temperature.  No rash or suspicious lesion is visible.   NODES:  None palpable at the neck PSYCH: alert, oriented x3.  Does not appear anxious nor depressed. Lab Results  Component Value Date   WBC 4.5 06/27/2012   HGB 15.2 06/27/2012   HCT 45.5 06/27/2012   PLT 138.0* 06/27/2012   GLUCOSE 70 06/27/2012   CHOL 155 06/27/2012   TRIG 57.0 06/27/2012   HDL 54.30 06/27/2012   LDLCALC 89 06/27/2012   ALT 18 06/27/2012   AST 22 06/27/2012   NA 141 06/27/2012   K 4.6 06/27/2012    CL 106 06/27/2012   CREATININE 1.0 06/27/2012   BUN 17 06/27/2012   CO2 28 06/27/2012   TSH 0.27* 06/27/2012   PSA 0.88 11/23/2010   INR 0.9 09/19/2008      Assessment & Plan:  Wellness visit today, with problems stable, except as noted.

## 2012-06-30 NOTE — Patient Instructions (Addendum)
please consider these measures for your health:  minimize alcohol.  do not use tobacco products.  have a colonoscopy at least every 10 years from age 44.  keep firearms safely stored.  always use seat belts.  have working smoke alarms in your home.  see an eye doctor and dentist regularly.  never drive under the influence of alcohol or drugs (including prescription drugs).  those with fair skin should take precautions against the sun. Please return in 1 year.   

## 2012-08-11 ENCOUNTER — Other Ambulatory Visit: Payer: Self-pay | Admitting: Endocrinology

## 2012-11-18 ENCOUNTER — Other Ambulatory Visit: Payer: Self-pay | Admitting: Endocrinology

## 2012-11-22 ENCOUNTER — Telehealth: Payer: Self-pay | Admitting: Endocrinology

## 2012-11-22 MED ORDER — ESZOPICLONE 2 MG PO TABS: 2.0000 mg | ORAL_TABLET | Freq: Every day | ORAL | Status: DC

## 2012-11-22 NOTE — Telephone Encounter (Signed)
The patient called to check the status of his Lunesta rx.  The patient uses the [facility] on [address].  The patient may be reached at [phone] if needed.

## 2012-12-31 ENCOUNTER — Other Ambulatory Visit: Payer: Self-pay | Admitting: Endocrinology

## 2013-03-01 ENCOUNTER — Other Ambulatory Visit: Payer: Self-pay | Admitting: Endocrinology

## 2013-04-03 ENCOUNTER — Encounter: Payer: Self-pay | Admitting: Endocrinology

## 2013-04-03 ENCOUNTER — Ambulatory Visit (INDEPENDENT_AMBULATORY_CARE_PROVIDER_SITE_OTHER): Payer: BC Managed Care – PPO | Admitting: Endocrinology

## 2013-04-03 VITALS — BP 124/70 | HR 66 | Temp 98.8°F | Ht 68.0 in | Wt 156.0 lb

## 2013-04-03 DIAGNOSIS — R071 Chest pain on breathing: Secondary | ICD-10-CM

## 2013-04-03 DIAGNOSIS — R0789 Other chest pain: Secondary | ICD-10-CM

## 2013-04-03 MED ORDER — HYDROCODONE-ACETAMINOPHEN 5-325 MG PO TABS: 1.0000 | ORAL_TABLET | ORAL | Status: DC | PRN

## 2013-04-03 MED ORDER — VALACYCLOVIR HCL 1 G PO TABS: 1000.0000 mg | ORAL_TABLET | Freq: Three times a day (TID) | ORAL | Status: DC

## 2013-04-03 NOTE — Patient Instructions (Addendum)
i have sent a prescription to your pharmacy, for a pill against the zoster virus.   I hope you feel better soon.  If you don't feel better by next week, please call back.  Please call sooner if you get worse.

## 2013-04-03 NOTE — Progress Notes (Signed)
  Subjective:    Patient ID: [name], male    DOB: 04-22-1966, 47 y.o.   MRN: [id number]  HPI Pt states few days of moderate pain at the right chest wall, and assoc rash.   Past Medical History  Diagnosis Date  . Prostatitis 2001  . Cramp of limb   . Postsurgical hypothyroidism   . Malignant neoplasm of thyroid gland   . Encounter for long-term (current) use of other medications   . Nontoxic uninodular goiter Left  . Backache, unspecified     No past surgical history on file.  History   Social History  . Marital Status: Married    Spouse Name: N/A    Number of Children: N/A  . Years of Education: N/A   Occupational History  . Not on file.   Social History Main Topics  . Smoking status: Never Smoker   . Smokeless tobacco: Not on file     Comment: Frequent travel related to job. Spouse is a GYN PA  . Alcohol Use:   . Drug Use:   . Sexually Active:    Other Topics Concern  . Not on file   Social History Narrative  . No narrative on file    Current Outpatient Prescriptions on File Prior to Visit  Medication Sig Dispense Refill  . atorvastatin (LIPITOR) 20 MG tablet take 1 tablet by mouth once daily  90 tablet  3  . clonazePAM (KLONOPIN) 0.5 MG tablet take 1 tablet by mouth at bedtime if needed for sleep  30 tablet  3  . eszopiclone (LUNESTA) 2 MG TABS Take 1 tablet (2 mg total) by mouth at bedtime. Take immediately before bedtime  30 tablet  3  . levothyroxine (SYNTHROID, LEVOTHROID) 150 MCG tablet take 1 tablet by mouth once daily  90 tablet  3   No current facility-administered medications on file prior to visit.    No Known Allergies  Family History  Problem Relation Age of Onset  . Cancer Neg Hx     BP 124/70  Pulse 66  Temp(Src) 98.8 F (37.1 C)  Ht 5\' 8"  (1.727 m)  Wt 156 lb (70.761 kg)  BMI 23.73 kg/m2  SpO2 97%   Review of Systems Denies fever.      Objective:   Physical Exam VITAL SIGNS:  See vs page.   GENERAL: no distress.    Skin: mild red rash in a band-like distribution at the right chest wall.         Assessment & Plan:  Chest wall pain and rash, prob due to zoster, new

## 2013-04-30 ENCOUNTER — Other Ambulatory Visit: Payer: Self-pay | Admitting: Endocrinology

## 2013-04-30 MED ORDER — LEVOTHYROXINE SODIUM 150 MCG PO TABS: ORAL_TABLET | ORAL | Status: DC

## 2013-05-04 ENCOUNTER — Other Ambulatory Visit: Payer: Self-pay | Admitting: Endocrinology

## 2013-05-17 ENCOUNTER — Other Ambulatory Visit: Payer: Self-pay | Admitting: *Deleted

## 2013-05-17 ENCOUNTER — Other Ambulatory Visit: Payer: Self-pay | Admitting: Endocrinology

## 2013-05-17 MED ORDER — ATORVASTATIN CALCIUM 20 MG PO TABS: 20.0000 mg | ORAL_TABLET | Freq: Every day | ORAL | Status: DC

## 2013-05-18 ENCOUNTER — Other Ambulatory Visit: Payer: Self-pay | Admitting: Endocrinology

## 2013-06-22 ENCOUNTER — Telehealth: Payer: Self-pay

## 2013-06-22 MED ORDER — VALACYCLOVIR HCL 1 G PO TABS: 1000.0000 mg | ORAL_TABLET | Freq: Three times a day (TID) | ORAL | Status: DC

## 2013-06-22 MED ORDER — METHYLPREDNISOLONE (PAK) 4 MG PO TABS: ORAL_TABLET | ORAL | Status: DC

## 2013-06-22 NOTE — Telephone Encounter (Signed)
Pt advised rxs sent.

## 2013-06-22 NOTE — Telephone Encounter (Signed)
i sent rxs 

## 2013-06-22 NOTE — Telephone Encounter (Signed)
Pt left voicemail stating he is out of town and thinks he is getting shingles again, would like to get the rx's for antiviral and prednisone that were given to him back in June [facility] in [place] [address]

## 2013-07-03 ENCOUNTER — Other Ambulatory Visit: Payer: Self-pay

## 2013-07-03 ENCOUNTER — Telehealth: Payer: Self-pay | Admitting: Endocrinology

## 2013-07-03 ENCOUNTER — Other Ambulatory Visit: Payer: BC Managed Care – PPO

## 2013-07-03 ENCOUNTER — Other Ambulatory Visit: Payer: Self-pay | Admitting: Endocrinology

## 2013-07-03 DIAGNOSIS — Z79899 Other long term (current) drug therapy: Secondary | ICD-10-CM

## 2013-07-03 DIAGNOSIS — Z125 Encounter for screening for malignant neoplasm of prostate: Secondary | ICD-10-CM

## 2013-07-03 MED ORDER — ESZOPICLONE 2 MG PO TABS: 2.0000 mg | ORAL_TABLET | Freq: Every day | ORAL | Status: DC

## 2013-07-03 NOTE — Telephone Encounter (Signed)
rx faxed

## 2013-07-03 NOTE — Telephone Encounter (Signed)
i printed 

## 2013-07-05 ENCOUNTER — Telehealth: Payer: Self-pay | Admitting: Endocrinology

## 2013-07-06 ENCOUNTER — Encounter: Payer: BC Managed Care – PPO | Admitting: Endocrinology

## 2013-07-13 ENCOUNTER — Other Ambulatory Visit: Payer: Self-pay

## 2013-07-13 ENCOUNTER — Other Ambulatory Visit (INDEPENDENT_AMBULATORY_CARE_PROVIDER_SITE_OTHER): Payer: BC Managed Care – PPO

## 2013-07-13 DIAGNOSIS — Z0000 Encounter for general adult medical examination without abnormal findings: Secondary | ICD-10-CM

## 2013-07-13 DIAGNOSIS — Z125 Encounter for screening for malignant neoplasm of prostate: Secondary | ICD-10-CM

## 2013-07-13 LAB — BASIC METABOLIC PANEL
CO2: 31 mEq/L (ref 19–32)
Calcium: 8.9 mg/dL (ref 8.4–10.5)
Chloride: 107 mEq/L (ref 96–112)
Sodium: 140 mEq/L (ref 135–145)

## 2013-07-13 LAB — CBC WITH DIFFERENTIAL/PLATELET
Basophils Absolute: 0 10*3/uL (ref 0.0–0.1)
Basophils Relative: 0.4 % (ref 0.0–3.0)
Eosinophils Absolute: 0 10*3/uL (ref 0.0–0.7)
HCT: 43.2 % (ref 39.0–52.0)
Hemoglobin: 14.8 g/dL (ref 13.0–17.0)
Lymphs Abs: 1.2 10*3/uL (ref 0.7–4.0)
MCHC: 34.3 g/dL (ref 30.0–36.0)
MCV: 90.1 fl (ref 78.0–100.0)
Neutro Abs: 2.4 10*3/uL (ref 1.4–7.7)
RDW: 12.9 % (ref 11.5–14.6)

## 2013-07-13 LAB — URINALYSIS, ROUTINE W REFLEX MICROSCOPIC
Bilirubin Urine: NEGATIVE
Ketones, ur: NEGATIVE
RBC / HPF: NONE SEEN (ref 0–?)
Specific Gravity, Urine: 1.02 (ref 1.000–1.030)
Total Protein, Urine: NEGATIVE
Urine Glucose: NEGATIVE
pH: 6 (ref 5.0–8.0)

## 2013-07-13 LAB — HEPATIC FUNCTION PANEL
Albumin: 3.8 g/dL (ref 3.5–5.2)
Total Protein: 6.3 g/dL (ref 6.0–8.3)

## 2013-07-13 LAB — LIPID PANEL
Cholesterol: 144 mg/dL (ref 0–200)
HDL: 45.6 mg/dL (ref >39.00)
Triglycerides: 64 mg/dL (ref 0.0–149.0)

## 2013-07-13 LAB — PSA: PSA: 0.83 ng/mL (ref 0.10–4.00)

## 2013-07-13 LAB — TSH: TSH: 0.22 u[IU]/mL — ABNORMAL LOW (ref 0.35–5.50)

## 2013-07-16 ENCOUNTER — Encounter: Payer: Self-pay | Admitting: Endocrinology

## 2013-07-16 ENCOUNTER — Ambulatory Visit (INDEPENDENT_AMBULATORY_CARE_PROVIDER_SITE_OTHER): Payer: BC Managed Care – PPO | Admitting: Endocrinology

## 2013-07-16 VITALS — BP 132/80 | HR 78 | Ht 68.0 in | Wt 154.0 lb

## 2013-07-16 DIAGNOSIS — Z23 Encounter for immunization: Secondary | ICD-10-CM

## 2013-07-16 DIAGNOSIS — Z0000 Encounter for general adult medical examination without abnormal findings: Secondary | ICD-10-CM

## 2013-07-16 MED ORDER — LEVOTHYROXINE SODIUM 137 MCG PO TABS: 137.0000 ug | ORAL_TABLET | Freq: Every day | ORAL | Status: DC

## 2013-07-16 NOTE — Progress Notes (Signed)
Subjective:    Patient ID: [name], male    DOB: Dec 25, 1965, 47 y.o.   MRN: [id number]  HPI Pt is here for regular wellness examination, and is feeling pretty well in general, and says chronic med probs are stable, except as noted below Past Medical History  Diagnosis Date  . Prostatitis 2001  . Cramp of limb   . Postsurgical hypothyroidism   . Malignant neoplasm of thyroid gland   . Encounter for long-term (current) use of other medications   . Nontoxic uninodular goiter Left  . Backache, unspecified     No past surgical history on file.  History   Social History  . Marital Status: Married    Spouse Name: N/A    Number of Children: N/A  . Years of Education: N/A   Occupational History  . Not on file.   Social History Main Topics  . Smoking status: Never Smoker   . Smokeless tobacco: Not on file     Comment: Frequent travel related to job. Spouse is a GYN PA  . Alcohol Use:   . Drug Use:   . Sexual Activity:    Other Topics Concern  . Not on file   Social History Narrative  . No narrative on file   Current Outpatient Prescriptions on File Prior to Visit  Medication Sig Dispense Refill  . atorvastatin (LIPITOR) 20 MG tablet Take 1 tablet (20 mg total) by mouth daily.  90 tablet  3  . clonazePAM (KLONOPIN) 0.5 MG tablet take 1 tablet by mouth at bedtime if needed for sleep  30 tablet  1  . eszopiclone (LUNESTA) 2 MG TABS tablet Take 1 tablet (2 mg total) by mouth at bedtime. Take immediately before bedtime  30 tablet  3   No current facility-administered medications on file prior to visit.   No Known Allergies  Family History  Problem Relation Age of Onset  . Cancer Neg Hx    BP 132/80  Pulse 78  Ht 5\' 8"  (1.727 m)  Wt 154 lb (69.854 kg)  BMI 23.42 kg/m2  SpO2 98%  Review of Systems  Constitutional: Negative for fever and unexpected weight change.  HENT: Negative for hearing loss.   Eyes: Negative for visual disturbance.  Respiratory:  Negative for shortness of breath.   Cardiovascular: Negative for chest pain.  Gastrointestinal: Negative for anal bleeding.  Endocrine: Negative for cold intolerance.  Genitourinary: Negative for hematuria.  Musculoskeletal: Negative for back pain.  Skin: Negative for rash.  Allergic/Immunologic: Negative for environmental allergies.  Neurological: Negative for numbness.  Hematological: Does not bruise/bleed easily.  Psychiatric/Behavioral:       Insomnia is mild       Objective:   Physical Exam VS: see vs page GEN: no distress HEAD: head: no deformity eyes: no periorbital swelling, no proptosis external nose and ears are normal mouth: no lesion seen NECK: a healed scar is present.  i do not appreciate a nodule in the thyroid or elsewhere in the neck CHEST WALL: no deformity LUNGS: clear to auscultation BREASTS:  No gynecomastia CV: reg rate and rhythm, no murmur ABD: abdomen is soft, nontender.  no hepatosplenomegaly.  not distended.  no hernia.   MUSCULOSKELETAL: muscle bulk and strength are grossly normal.  no obvious joint swelling.  gait is normal and steady EXTEMITIES: no deformity.  no ulcer on the feet.  feet are of normal color and temp.  no edema PULSES: dorsalis pedis intact bilat.  no carotid bruit  NEURO:  cn 2-12 grossly intact.   readily moves all 4's.  sensation is intact to touch on the feet SKIN:  Normal texture and temperature.  No rash or suspicious lesion is visible.   NODES:  None palpable at the neck PSYCH: alert, oriented x3.  Does not appear anxious nor depressed.   Lab Results  Component Value Date   WBC 4.1* 07/13/2013   HGB 14.8 07/13/2013   HCT 43.2 07/13/2013   PLT 124.0* 07/13/2013   GLUCOSE 85 07/13/2013   CHOL 144 07/13/2013   TRIG 64.0 07/13/2013   HDL 45.60 07/13/2013   LDLCALC 86 07/13/2013   ALT 17 07/13/2013   AST 20 07/13/2013   NA 140 07/13/2013   K 5.0 07/13/2013   CL 107 07/13/2013   CREATININE 1.0 07/13/2013   BUN 19 07/13/2013   CO2 31  07/13/2013   TSH 0.22* 07/13/2013   PSA 0.83 07/13/2013   INR 0.9 09/19/2008      Assessment & Plan:  Wellness visit today, with problems stable, except as noted.

## 2013-07-16 NOTE — Patient Instructions (Addendum)
please consider these measures for your health:  minimize alcohol.  do not use tobacco products.  have a colonoscopy at least every 10 years from age 45.  keep firearms safely stored.  always use seat belts.  have working smoke alarms in your home.  see an eye doctor and dentist regularly.  never drive under the influence of alcohol or drugs (including prescription drugs).  those with fair skin should take precautions against the sun. Please recheck your blood tests in 1-2 months.   i have sent a prescription to your pharmacy, to reduce the levothyroxine.

## 2013-08-07 ENCOUNTER — Other Ambulatory Visit: Payer: Self-pay | Admitting: Endocrinology

## 2013-08-07 ENCOUNTER — Other Ambulatory Visit: Payer: Self-pay | Admitting: *Deleted

## 2013-08-07 MED ORDER — CLONAZEPAM 0.5 MG PO TABS: 0.5000 mg | ORAL_TABLET | Freq: Every evening | ORAL | Status: DC | PRN

## 2013-08-07 NOTE — Telephone Encounter (Signed)
i already printed this earlier today.

## 2013-11-21 ENCOUNTER — Other Ambulatory Visit (INDEPENDENT_AMBULATORY_CARE_PROVIDER_SITE_OTHER): Payer: BC Managed Care – PPO

## 2013-11-21 ENCOUNTER — Other Ambulatory Visit: Payer: Self-pay

## 2013-11-21 DIAGNOSIS — Z0000 Encounter for general adult medical examination without abnormal findings: Secondary | ICD-10-CM

## 2013-11-21 LAB — LIPID PANEL
CHOLESTEROL: 155 mg/dL (ref 0–200)
HDL: 54.1 mg/dL (ref >39.00)
LDL Cholesterol: 86 mg/dL (ref 0–99)
Total CHOL/HDL Ratio: 3
Triglycerides: 75 mg/dL (ref 0.0–149.0)
VLDL: 15 mg/dL (ref 0.0–40.0)

## 2013-11-21 LAB — CBC WITH DIFFERENTIAL/PLATELET
BASOS ABS: 0 10*3/uL (ref 0.0–0.1)
BASOS PCT: 0.5 % (ref 0.0–3.0)
EOS ABS: 0 10*3/uL (ref 0.0–0.7)
Eosinophils Relative: 0.5 % (ref 0.0–5.0)
HCT: 44.3 % (ref 39.0–52.0)
Hemoglobin: 15 g/dL (ref 13.0–17.0)
LYMPHS PCT: 27.5 % (ref 12.0–46.0)
Lymphs Abs: 1.3 10*3/uL (ref 0.7–4.0)
MCHC: 33.9 g/dL (ref 30.0–36.0)
MCV: 88.7 fl (ref 78.0–100.0)
Monocytes Absolute: 0.3 10*3/uL (ref 0.1–1.0)
Monocytes Relative: 6 % (ref 3.0–12.0)
Neutro Abs: 3 10*3/uL (ref 1.4–7.7)
Neutrophils Relative %: 65.5 % (ref 43.0–77.0)
Platelets: 149 10*3/uL — ABNORMAL LOW (ref 150.0–400.0)
RBC: 4.99 Mil/uL (ref 4.22–5.81)
RDW: 13.3 % (ref 11.5–14.6)
WBC: 4.6 10*3/uL (ref 4.5–10.5)

## 2013-11-21 LAB — MICROALBUMIN / CREATININE URINE RATIO
CREATININE, U: 99.8 mg/dL
Microalb Creat Ratio: 0.7 mg/g (ref 0.0–30.0)
Microalb, Ur: 0.7 mg/dL (ref 0.0–1.9)

## 2013-11-21 LAB — HEMOGLOBIN A1C: HEMOGLOBIN A1C: 5.2 % (ref 4.6–6.5)

## 2013-11-21 LAB — PSA: PSA: 1.01 ng/mL (ref 0.10–4.00)

## 2013-11-21 LAB — TSH: TSH: 2.98 u[IU]/mL (ref 0.35–5.50)

## 2013-11-22 LAB — HEPATIC FUNCTION PANEL (6)
ALBUMIN: 4.6 g/dL (ref 3.5–5.5)
ALK PHOS: 63 IU/L (ref 39–117)
ALT: 17 IU/L (ref 0–44)
AST: 18 IU/L (ref 0–40)
BILIRUBIN TOTAL: 0.4 mg/dL (ref 0.0–1.2)
Bilirubin, Direct: 0.11 mg/dL (ref 0.00–0.40)

## 2013-11-22 LAB — BASIC METABOLIC PANEL WITH GFR
BUN: 22 mg/dL (ref 6–23)
CHLORIDE: 103 meq/L (ref 96–112)
CO2: 28 mEq/L (ref 19–32)
Calcium: 9.4 mg/dL (ref 8.4–10.5)
Creat: 1 mg/dL (ref 0.50–1.35)
GFR, Est African American: >89 mL/min
GFR, Est Non African American: >89 mL/min
GLUCOSE: 85 mg/dL (ref 70–99)
Potassium: 4.9 mEq/L (ref 3.5–5.3)
Sodium: 140 mEq/L (ref 135–145)

## 2013-11-27 ENCOUNTER — Telehealth: Payer: Self-pay

## 2013-11-27 MED ORDER — OSELTAMIVIR PHOSPHATE 75 MG PO CAPS: 75.0000 mg | ORAL_CAPSULE | Freq: Two times a day (BID) | ORAL | Status: AC

## 2013-11-27 NOTE — Telephone Encounter (Signed)
i have sent a prescription to [facility] [place].

## 2013-11-27 NOTE — Telephone Encounter (Signed)
Pt called requesting a script for tama-flu. States that his wife and daughter both have a severe case of the flu. 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 Pt did not state that he has any signs or symptoms of the flu.  Please advise, Thanks!

## 2013-11-27 NOTE — Telephone Encounter (Signed)
Pt informed

## 2013-12-14 ENCOUNTER — Other Ambulatory Visit: Payer: Self-pay | Admitting: Endocrinology

## 2014-01-10 ENCOUNTER — Telehealth: Payer: Self-pay

## 2014-01-10 MED ORDER — ESZOPICLONE 2 MG PO TABS: 2.0000 mg | ORAL_TABLET | Freq: Every day | ORAL | Status: DC

## 2014-01-10 NOTE — Telephone Encounter (Signed)
Script faxed to pharmacy

## 2014-01-10 NOTE — Telephone Encounter (Signed)
i printed 

## 2014-01-10 NOTE — Telephone Encounter (Signed)
Received fax from pharmacy requesting a refill on Lunesta 2mg . Pt was last seen on 07/16/2013 and medication was last filled on 07/03/2013. Thanks!

## 2014-03-04 ENCOUNTER — Telehealth: Payer: Self-pay

## 2014-03-04 MED ORDER — CLONAZEPAM 0.5 MG PO TABS: 0.5000 mg | ORAL_TABLET | Freq: Every evening | ORAL | Status: AC | PRN

## 2014-03-04 NOTE — Telephone Encounter (Signed)
i printed 

## 2014-03-04 NOTE — Telephone Encounter (Signed)
Rx faxed to pharmacy  

## 2014-03-04 NOTE — Telephone Encounter (Signed)
Received fax from pharmacy requesting a refill on Clonazepam 0.5 mg. Pt was last seen on 07/16/2013 and medication was last refilled on 08/07/2013.  Thanks!

## 2014-04-09 ENCOUNTER — Other Ambulatory Visit: Payer: Self-pay | Admitting: Endocrinology

## 2014-06-10 ENCOUNTER — Other Ambulatory Visit: Payer: Self-pay | Admitting: Endocrinology

## 2014-06-14 ENCOUNTER — Other Ambulatory Visit: Payer: Self-pay | Admitting: Endocrinology

## 2014-07-02 ENCOUNTER — Ambulatory Visit (INDEPENDENT_AMBULATORY_CARE_PROVIDER_SITE_OTHER): Payer: BC Managed Care – PPO | Admitting: Endocrinology

## 2014-07-02 ENCOUNTER — Other Ambulatory Visit (HOSPITAL_COMMUNITY)
Admission: RE | Admit: 2014-07-02 | Discharge: 2014-07-02 | Disposition: A | Discharge status: Home / Self Care | Payer: BC Managed Care – PPO | Source: Ambulatory Visit | Attending: Endocrinology | Admitting: Endocrinology

## 2014-07-02 ENCOUNTER — Encounter: Payer: Self-pay | Admitting: Endocrinology

## 2014-07-02 VITALS — BP 126/72 | HR 72 | Temp 98.3°F | Ht 68.0 in | Wt 158.0 lb

## 2014-07-02 DIAGNOSIS — C73 Malignant neoplasm of thyroid gland: Secondary | ICD-10-CM | POA: Diagnosis not present

## 2014-07-02 DIAGNOSIS — R221 Localized swelling, mass and lump, neck: Principal | ICD-10-CM

## 2014-07-02 DIAGNOSIS — R220 Localized swelling, mass and lump, head: Secondary | ICD-10-CM | POA: Insufficient documentation

## 2014-07-02 LAB — TSH: TSH: 1.087 u[IU]/mL (ref 0.350–4.500)

## 2014-07-02 NOTE — Progress Notes (Signed)
Subjective:    Patient ID: [name], male    DOB: December 19, 1965, 48 y.o.   MRN: [id number]  HPI Pt returns for f/u of stage-1 papillary adenocarcinoma of the thyroid, with this chronology: 10/09: left thyroid nodule incidentally noted at CPX. 10/09: [place]: confirms nodule. 10/09: needle bx: papillary adenocarcinoma. 11/09: thyroidectomy: 2.2 cm left lobe papillary adenocarcinoma, 5/5 nodes neg (T2 N0 Mx). 2/10: I-131 rx 102 [facility]. 2/10: post-therapy scan pos only at the thyroid bed. 8/10: TG=0.5 (ab neg). 9/10: TG=43 (ab neg). 9/10: [place]: no residual thyroid tissue. 9/10: I-131 rx 203 [facility] 10/10: post-therapy scan neg 2/11: CT of face (done due to parotiditis): normal 4/11: TG undetectable (ab=41) 7/11: TG undetectable (ab=29) 1/12: TG undetectable (ab neg) 9/12: TG undetectable (ab neg) 8/13: TG undetectable (ab neg)  Pt now states 2 mos of slight swelling at the left submandibular area, but no assoc pain. Past Medical History  Diagnosis Date  . Prostatitis 2001  . Cramp of limb   . Postsurgical hypothyroidism   . Malignant neoplasm of thyroid gland   . Encounter for long-term (current) use of other medications   . Nontoxic uninodular goiter Left  . Backache, unspecified     No past surgical history on file.  History   Social History  . Marital Status: Married    Spouse Name: N/A    Number of Children: N/A  . Years of Education: N/A   Occupational History  . Not on file.   Social History Main Topics  . Smoking status: Never Smoker   . Smokeless tobacco: Not on file     Comment: Frequent travel related to job. Spouse is a GYN PA  . Alcohol Use:   . Drug Use:   . Sexual Activity:    Other Topics Concern  . Not on file   Social History Narrative  . No narrative on file    Current Outpatient Prescriptions on File Prior to Visit  Medication Sig Dispense Refill  . atorvastatin (LIPITOR) 20 MG tablet TAKE ONE TABLET BY MOUTH EVERY DAY  30 tablet  1  .  clonazePAM (KLONOPIN) 0.5 MG tablet Take 1 tablet (0.5 mg total) by mouth at bedtime as needed (sleep).  30 tablet  5  . eszopiclone (LUNESTA) 2 MG TABS tablet Take 1 tablet (2 mg total) by mouth at bedtime. Take immediately before bedtime  30 tablet  3  . levothyroxine (SYNTHROID, LEVOTHROID) 137 MCG tablet TAKE ONE TABLET BY MOUTH EVERY MORNING BEFORE BREAKFAST  90 tablet  3  . oseltamivir (TAMIFLU) 75 MG capsule Take 1 capsule (75 mg total) by mouth 2 (two) times daily.  10 capsule  0   No current facility-administered medications on file prior to visit.    No Known Allergies  Family History  Problem Relation Age of Onset  . Cancer Neg Hx     BP 126/72  Pulse 72  Temp(Src) 98.3 F (36.8 C) (Oral)  Ht 5\' 8"  (1.727 m)  Wt 158 lb (71.668 kg)  BMI 24.03 kg/m2  SpO2 97%  Review of Systems Denies any other neck nodule.    Objective:   Physical Exam VITAL SIGNS:  See vs page GENERAL: no distress Neck: a healed scar is present.  i do not appreciate a nodule in the thyroid Nodes: 2 cm left neck node, near submandibular area.   needle bx of left neck node: consent obtained, signed form on chart The area is first sprayed with cooling agent local:  xylocaine 2%, with epinephrine prep: alcohol pad 4 bxs are done with 25 and 16X needles no complications  WR=6.0    Assessment & Plan:  Thyroid cancer, with recurrent thyroglobulinemia.  Lymph node: new, uncertain etiology.   Patient is advised the following: Patient Instructions  blood tests are being requested for you today.  We'll contact you with results of this and the biopsy in a few days.

## 2014-07-02 NOTE — Patient Instructions (Signed)
blood tests are being requested for you today.  We'll contact you with results of this and the biopsy in a few days.

## 2014-07-03 LAB — THYROGLOBULIN ANTIBODY: Thyroglobulin Ab: <1 IU/mL (ref <2)

## 2014-07-03 LAB — THYROGLOBULIN LEVEL: THYROGLOBULIN: 1.1 ng/mL — AB (ref 2.8–40.9)

## 2014-07-04 ENCOUNTER — Telehealth: Payer: Self-pay | Admitting: Endocrinology

## 2014-07-04 NOTE — Telephone Encounter (Signed)
Pt advised of recent blood work.

## 2014-07-04 NOTE — Telephone Encounter (Signed)
Patient would like to know the result of his lab work.

## 2014-07-05 ENCOUNTER — Telehealth: Payer: Self-pay | Admitting: Endocrinology

## 2014-07-05 NOTE — Telephone Encounter (Signed)
Contacted pt and advised that I had spoke to [facility] this morning. Pathology report from thyroid bx should be resulted this morning and results should be at our office this afternoon. Pt advised that as soon as we received bx results we would contact him.

## 2014-07-05 NOTE — Telephone Encounter (Signed)
Pt awaiting lab results

## 2014-07-09 ENCOUNTER — Other Ambulatory Visit: Payer: Self-pay | Admitting: Endocrinology

## 2014-08-05 ENCOUNTER — Encounter: Payer: Self-pay | Admitting: Endocrinology

## 2014-08-12 ENCOUNTER — Other Ambulatory Visit: Payer: Self-pay | Admitting: Endocrinology

## 2014-11-11 ENCOUNTER — Telehealth: Payer: Self-pay

## 2014-11-11 MED ORDER — ESZOPICLONE 2 MG PO TABS: 2.0000 mg | ORAL_TABLET | Freq: Every day | ORAL | Status: AC

## 2014-11-11 NOTE — Telephone Encounter (Signed)
i printed 

## 2014-11-11 NOTE — Telephone Encounter (Signed)
Rx faxed to pharmacy  

## 2014-11-11 NOTE — Telephone Encounter (Signed)
Received a refill request for Lunesta. Medication was last refilled on 07/09/2014 and pt was last seen on 07/02/2014.  Please advised if ok to refill.  Thanks!

## 2015-10-08 ENCOUNTER — Other Ambulatory Visit: Payer: Self-pay | Admitting: Endocrinology

## 2015-10-08 NOTE — Telephone Encounter (Signed)
Please advise if ok to refill. Last office visit was 07/02/2014.
